# Patient Record
Sex: Male | Born: 1950 | Race: White | Hispanic: No | Marital: Married | Smoking: Former smoker
Health system: Southern US, Community
[De-identification: ages and names within clinical notes are randomized; demographics above are authoritative.]

## PROBLEM LIST (undated history)

## (undated) DIAGNOSIS — I10 Essential (primary) hypertension: Secondary | ICD-10-CM

## (undated) DIAGNOSIS — E7800 Pure hypercholesterolemia, unspecified: Secondary | ICD-10-CM

## (undated) DIAGNOSIS — N400 Enlarged prostate without lower urinary tract symptoms: Secondary | ICD-10-CM

## (undated) DIAGNOSIS — J189 Pneumonia, unspecified organism: Secondary | ICD-10-CM

## (undated) DIAGNOSIS — M109 Gout, unspecified: Secondary | ICD-10-CM

## (undated) DIAGNOSIS — K219 Gastro-esophageal reflux disease without esophagitis: Secondary | ICD-10-CM

## (undated) DIAGNOSIS — N281 Cyst of kidney, acquired: Secondary | ICD-10-CM

## (undated) HISTORY — PX: THROAT SURGERY: SHX803

## (undated) HISTORY — PX: CARPAL TUNNEL RELEASE: SHX101

## (undated) HISTORY — DX: Cyst of kidney, acquired: N28.1

## (undated) HISTORY — PX: BACK SURGERY: SHX140

---

## 2009-12-18 ENCOUNTER — Ambulatory Visit: Payer: Self-pay | Admitting: Cardiology

## 2011-11-25 ENCOUNTER — Encounter (HOSPITAL_COMMUNITY)
Admission: EM | Disposition: A | Discharge status: Home / Self Care | Payer: Self-pay | Source: Home / Self Care | Attending: Emergency Medicine

## 2011-11-25 ENCOUNTER — Emergency Department (HOSPITAL_COMMUNITY): Payer: No Typology Code available for payment source

## 2011-11-25 ENCOUNTER — Encounter (HOSPITAL_COMMUNITY): Payer: Self-pay | Admitting: *Deleted

## 2011-11-25 ENCOUNTER — Emergency Department (HOSPITAL_COMMUNITY): Payer: No Typology Code available for payment source | Admitting: Critical Care Medicine

## 2011-11-25 ENCOUNTER — Encounter (HOSPITAL_COMMUNITY): Payer: Self-pay | Admitting: Critical Care Medicine

## 2011-11-25 ENCOUNTER — Emergency Department (HOSPITAL_COMMUNITY)
Admission: EM | Admit: 2011-11-25 | Discharge: 2011-11-26 | Disposition: A | Discharge status: Home / Self Care | Payer: No Typology Code available for payment source | Attending: Emergency Medicine | Admitting: Emergency Medicine

## 2011-11-25 DIAGNOSIS — I10 Essential (primary) hypertension: Secondary | ICD-10-CM | POA: Insufficient documentation

## 2011-11-25 DIAGNOSIS — L720 Epidermal cyst: Secondary | ICD-10-CM

## 2011-11-25 DIAGNOSIS — K219 Gastro-esophageal reflux disease without esophagitis: Secondary | ICD-10-CM | POA: Insufficient documentation

## 2011-11-25 DIAGNOSIS — M542 Cervicalgia: Secondary | ICD-10-CM | POA: Insufficient documentation

## 2011-11-25 DIAGNOSIS — L0211 Cutaneous abscess of neck: Secondary | ICD-10-CM | POA: Insufficient documentation

## 2011-11-25 DIAGNOSIS — L723 Sebaceous cyst: Secondary | ICD-10-CM | POA: Insufficient documentation

## 2011-11-25 HISTORY — PX: EAR CYST EXCISION: SHX22

## 2011-11-25 HISTORY — DX: Essential (primary) hypertension: I10

## 2011-11-25 LAB — POCT I-STAT, CHEM 8
BUN: 12 mg/dL (ref 6–23)
Creatinine, Ser: 0.9 mg/dL (ref 0.50–1.35)
Hemoglobin: 15.3 g/dL (ref 13.0–17.0)
Potassium: 3.8 mEq/L (ref 3.5–5.1)
Sodium: 143 mEq/L (ref 135–145)

## 2011-11-25 LAB — CBC
Hemoglobin: 15.2 g/dL (ref 13.0–17.0)
MCHC: 36.9 g/dL — ABNORMAL HIGH (ref 30.0–36.0)
RBC: 4.75 MIL/uL (ref 4.22–5.81)

## 2011-11-25 SURGERY — CYST REMOVAL
Anesthesia: General | Wound class: Dirty or Infected

## 2011-11-25 MED ORDER — LACTATED RINGERS IV SOLN
INTRAVENOUS | Status: DC | PRN
  Administered 2011-11-25 (×2): via INTRAVENOUS

## 2011-11-25 MED ORDER — FENTANYL CITRATE 0.05 MG/ML IJ SOLN
INTRAMUSCULAR | Status: DC | PRN
  Administered 2011-11-25 (×2): 50 ug via INTRAVENOUS
  Administered 2011-11-25: 100 ug via INTRAVENOUS
  Administered 2011-11-25 (×2): 50 ug via INTRAVENOUS

## 2011-11-25 MED ORDER — SODIUM CHLORIDE 0.9 % IR SOLN
Status: DC | PRN
  Administered 2011-11-25: 23:00:00

## 2011-11-25 MED ORDER — LIDOCAINE HCL (CARDIAC) 20 MG/ML IV SOLN
INTRAVENOUS | Status: DC | PRN
  Administered 2011-11-25: 40 mg via INTRAVENOUS

## 2011-11-25 MED ORDER — SUCCINYLCHOLINE CHLORIDE 20 MG/ML IJ SOLN
INTRAMUSCULAR | Status: DC | PRN
  Administered 2011-11-25: 100 mg via INTRAVENOUS

## 2011-11-25 MED ORDER — MUPIROCIN CALCIUM 2 % EX CREA
TOPICAL_CREAM | CUTANEOUS | Status: AC
  Filled 2011-11-25: qty 15

## 2011-11-25 MED ORDER — IOHEXOL 300 MG/ML  SOLN
75.0000 mL | Freq: Once | INTRAMUSCULAR | Status: AC | PRN
  Administered 2011-11-25: 75 mL via INTRAVENOUS

## 2011-11-25 MED ORDER — ALBUTEROL SULFATE HFA 108 (90 BASE) MCG/ACT IN AERS
INHALATION_SPRAY | RESPIRATORY_TRACT | Status: DC | PRN
  Administered 2011-11-25: 2 via RESPIRATORY_TRACT
  Administered 2011-11-25: 4 via RESPIRATORY_TRACT

## 2011-11-25 MED ORDER — PROPOFOL 10 MG/ML IV BOLUS
INTRAVENOUS | Status: DC | PRN
  Administered 2011-11-25: 175 mg via INTRAVENOUS

## 2011-11-25 MED ORDER — LIDOCAINE-EPINEPHRINE 1 %-1:100000 IJ SOLN
INTRAMUSCULAR | Status: DC | PRN
  Administered 2011-11-25: 10 mL

## 2011-11-25 MED ORDER — CLINDAMYCIN PHOSPHATE 600 MG/50ML IV SOLN
600.0000 mg | Freq: Once | INTRAVENOUS | Status: AC
  Administered 2011-11-25: 600 mg via INTRAVENOUS
  Filled 2011-11-25: qty 50

## 2011-11-25 MED ORDER — 0.9 % SODIUM CHLORIDE (POUR BTL) OPTIME
TOPICAL | Status: DC | PRN
  Administered 2011-11-25: 1000 mL

## 2011-11-25 MED ORDER — MUPIROCIN 2 % EX OINT
TOPICAL_OINTMENT | CUTANEOUS | Status: DC | PRN
  Administered 2011-11-25: 1 via TOPICAL

## 2011-11-25 MED ORDER — ONDANSETRON HCL 4 MG/2ML IJ SOLN
INTRAMUSCULAR | Status: DC | PRN
  Administered 2011-11-25: 4 mg via INTRAVENOUS

## 2011-11-25 MED ORDER — DEXAMETHASONE SODIUM PHOSPHATE 4 MG/ML IJ SOLN
INTRAMUSCULAR | Status: DC | PRN
  Administered 2011-11-25: 8 mg via INTRAVENOUS

## 2011-11-25 SURGICAL SUPPLY — 24 items
CANISTER SUCTION 2500CC (MISCELLANEOUS) ×2 IMPLANT
CLOTH BEACON ORANGE TIMEOUT ST (SAFETY) ×2 IMPLANT
COVER SURGICAL LIGHT HANDLE (MISCELLANEOUS) ×2 IMPLANT
ELECT COATED BLADE 2.86 ST (ELECTRODE) ×2 IMPLANT
ELECT REM PT RETURN 9FT ADLT (ELECTROSURGICAL) ×2
ELECTRODE REM PT RTRN 9FT ADLT (ELECTROSURGICAL) ×1 IMPLANT
GLOVE BIOGEL PI IND STRL 7.0 (GLOVE) ×2 IMPLANT
GLOVE BIOGEL PI INDICATOR 7.0 (GLOVE) ×2
GLOVE ECLIPSE 6.5 STRL STRAW (GLOVE) ×4 IMPLANT
GLOVE SURG SS PI 7.5 STRL IVOR (GLOVE) ×2 IMPLANT
GOWN BRE IMP SLV AUR LG STRL (GOWN DISPOSABLE) ×6 IMPLANT
KIT BASIN OR (CUSTOM PROCEDURE TRAY) ×2 IMPLANT
PENCIL BUTTON HOLSTER BLD 10FT (ELECTRODE) ×2 IMPLANT
SPECIMEN JAR SMALL (MISCELLANEOUS) ×2 IMPLANT
SUT CHROMIC 3 0 SH 27 (SUTURE) ×2 IMPLANT
SUT CHROMIC 4 0 PS 2 18 (SUTURE) ×2 IMPLANT
SUT SILK 2 0 (SUTURE) ×1
SUT SILK 2-0 18XBRD TIE 12 (SUTURE) ×1 IMPLANT
SUT VIC AB 3-0 SH 27 (SUTURE) ×2
SUT VIC AB 3-0 SH 27X BRD (SUTURE) ×2 IMPLANT
SWAB COLLECTION DEVICE MRSA (MISCELLANEOUS) ×2 IMPLANT
TOWEL OR 17X26 10 PK STRL BLUE (TOWEL DISPOSABLE) ×2 IMPLANT
TRAY ENT MC OR (CUSTOM PROCEDURE TRAY) ×2 IMPLANT
TUBE CONNECTING 20X1/4 (TUBING) ×2 IMPLANT

## 2011-11-25 NOTE — ED Provider Notes (Signed)
Patient being taken to the OR by ENT.  Trevor Mace, PA-C 11/25/11 1742

## 2011-11-25 NOTE — Anesthesia Preprocedure Evaluation (Addendum)
Anesthesia Evaluation  Patient identified by MRN, date of birth, ID band Patient awake    Reviewed: Allergy & Precautions, H&P , NPO status , Patient's Chart, lab work & pertinent test results  Airway Mallampati: II TM Distance: >3 FB Neck ROM: Full    Dental  (+) Dental Advisory Given and Teeth Intact   Pulmonary  breath sounds clear to auscultation        Cardiovascular hypertension, Pt. on medications Rhythm:Regular Rate:Normal     Neuro/Psych    GI/Hepatic GERD-  Medicated,  Endo/Other    Renal/GU      Musculoskeletal   Abdominal   Peds  Hematology   Anesthesia Other Findings   Reproductive/Obstetrics                          Anesthesia Physical Anesthesia Plan  ASA: II and Emergent  Anesthesia Plan: General   Post-op Pain Management:    Induction: Intravenous  Airway Management Planned: Oral ETT  Additional Equipment:   Intra-op Plan:   Post-operative Plan: Extubation in OR  Informed Consent: I have reviewed the patients History and Physical, chart, labs and discussed the procedure including the risks, benefits and alternatives for the proposed anesthesia with the patient or authorized representative who has indicated his/her understanding and acceptance.   Dental advisory given  Plan Discussed with: Anesthesiologist and Surgeon  Anesthesia Plan Comments: (Infected cyst anterior neck htn Obesity Plan GA with ETT with RSI )       Anesthesia Quick Evaluation

## 2011-11-25 NOTE — ED Provider Notes (Addendum)
History   This chart was scribed for Doug Sou, MD by Melba Coon. The patient was seen in room TR05C/TR05C and the patient's care was started at 9:45AM.    CSN: [id number]  Arrival date & time 11/25/11  0932   None     Chief Complaint  Patient presents with  . Neck Pain    bump on neck    (Consider location/radiation/quality/duration/timing/severity/associated sxs/prior treatment) The history is provided by the patient. No language interpreter was used.   Christopher Salazar is a 61 y.o. male who presents to the Emergency Department complaining of constant, mild to moderate anterior neck pain pertaining to an abscess with an onset a week ago. Patient states he's had vague swelling and anterior neck for approximately one year. Pt states that he has had a "knot" in the location of the abscess for a "couple of years" but the area has progressively gotten more swollen and tender to touch within the last week. Pt has taken Equate OTC pain meds which slightly alleviates the pain. Amount of sleep is decreased compared to baseline. Pain with swallowing present. Undocumented fever present. Denies SOB. No known allergies. No other pertinent medical symptoms.  PCP: Dr. Baldemar Friday in [place], [place]  Past Medical History  Diagnosis Date  . Hypertension     History reviewed. No pertinent past surgical history. Past medical history hypertension No family history on file.  History  Substance Use Topics  . Smoking status: Not on file  . Smokeless tobacco: Not on file  . Alcohol Use: No  Pt does not smoke or drink    Review of Systems  Constitutional: Positive for chills.  HENT: Positive for sore throat and neck pain. Negative for ear pain, trouble swallowing, dental problem and voice change.   Respiratory: Negative.   Cardiovascular: Negative.   Gastrointestinal: Negative.   Psychiatric/Behavioral: Negative.   All other systems reviewed and are  negative.      Allergies  Review of patient's allergies indicates no known allergies.  Home Medications   Current Outpatient Rx  Name Route Sig Dispense Refill  . ASPIRIN EC 81 MG PO TBEC Oral Take 81 mg by mouth daily.    . OMEGA-3 FATTY ACIDS 1000 MG PO CAPS Oral Take 1 g by mouth daily.    Marland Kitchen GARLIC HIGH POTENCY PO Oral Take 1 tablet by mouth daily.    . ADULT MULTIVITAMIN W/MINERALS CH Oral Take 1 tablet by mouth daily.    Marland Kitchen OLMESARTAN-AMLODIPINE-HCTZ 40-10-25 MG PO TABS Oral Take 0.5 tablets by mouth 2 (two) times daily.    Marland Kitchen RANITIDINE HCL 75 MG PO TABS Oral Take 75 mg by mouth daily.    Marland Kitchen VITAMIN E 400 UNITS PO CAPS Oral Take 400 Units by mouth daily.      BP 170/91  Temp 97.9 F (36.6 C) (Oral)  Resp 18  SpO2 99%  Physical Exam  Nursing note and vitals reviewed. Constitutional: He is oriented to person, place, and time. He appears well-developed and well-nourished. No distress.  HENT:  Head: Normocephalic and atraumatic.  Mouth/Throat: Oropharynx is clear and moist. No oropharyngeal exudate.  Eyes: EOM are normal.  Neck: Neck supple. No tracheal deviation present.       Swollen reddened area overlying laryngeal cartilage no fluctuance  Cardiovascular: Normal rate, normal heart sounds and intact distal pulses.   No murmur heard. Pulmonary/Chest: Effort normal and breath sounds normal. No respiratory distress. He has no wheezes.  Abdominal: Soft. There is no  tenderness.       Obese  Musculoskeletal: Normal range of motion.  Lymphadenopathy:    He has no cervical adenopathy.  Neurological: He is alert and oriented to person, place, and time.  Skin: Skin is warm and dry. There is erythema (Erythematous swollen area over laryngeal cartilage with mild tenderness but no fluctuance or cervical nodes.).       Erythematous over laryngeal cartilage  Psychiatric: He has a normal mood and affect. His behavior is normal.    ED Course  Procedures (including critical care  time)  DIAGNOSTIC STUDIES: Oxygen Saturation is 99% on room air, normal by my interpretation.    COORDINATION OF CARE:  9:49AM - Pt declines pain meds at this time. IV fluids and neck CT will be ordered for the pt. Pt will be transferred to Pod A.   Labs Reviewed - No data to display No results found.   No diagnosis found.  Results for orders placed during the hospital encounter of 11/25/11  CBC      Component Value Range   WBC 12.0 (*) 4.0 - 10.5 K/uL   RBC 4.75  4.22 - 5.81 MIL/uL   Hemoglobin 15.2  13.0 - 17.0 g/dL   HCT 16.1  09.6 - 04.5 %   MCV 86.7  78.0 - 100.0 fL   MCH 32.0  26.0 - 34.0 pg   MCHC 36.9 (*) 30.0 - 36.0 g/dL   RDW 40.9  81.1 - 91.4 %   Platelets 254  150 - 400 K/uL  POCT I-STAT, CHEM 8      Component Value Range   Sodium 143  135 - 145 mEq/L   Potassium 3.8  3.5 - 5.1 mEq/L   Chloride 105  96 - 112 mEq/L   BUN 12  6 - 23 mg/dL   Creatinine, Ser 7.82  0.50 - 1.35 mg/dL   Glucose, Bld 956 (*) 70 - 99 mg/dL   Calcium, Ion 2.13  0.86 - 1.30 mmol/L   TCO2 24  0 - 100 mmol/L   Hemoglobin 15.3  13.0 - 17.0 g/dL   HCT 57.8  46.9 - 62.9 %   Ct Soft Tissue Neck W Contrast  11/25/2011  *RADIOLOGY REPORT*  Clinical Data: 61 year old male with swollen anterior neck with redness and pain. The patient reports chronic swelling or "knot" in the region, but recent pain and progression.  Laryngeal cartilage abscess.  CT NECK WITH CONTRAST  Technique:  Multidetector CT imaging of the neck was performed with intravenous contrast.  Contrast: 75mL OMNIPAQUE IOHEXOL 300 MG/ML  SOLN  Comparison: None.  Findings: A marker placed on the area of clinical concern overlies the right paramidline rounded mass which appears to be centered in the subcutaneous fat and associated with the dermis and platysma rather than the strap muscles.  There is no laryngeal or thyroid cartilage involvement.  There is no thyroid gland involvement.  The mass measures 27 x 23 x 30 mm with surrounding skin  thickening and stranding.  Internally densitometry suggests a complex fluid.   Thyroid and visualized superior mediastinal structures are within normal limits.  Sternoclavicular joints are within normal limits. Lung apices are negative. Negative larynx, pharynx, parapharyngeal spaces, retropharyngeal space, sublingual space, submandibular glands, parotid glands and visualized brain parenchyma.  Major vascular structures in the neck are patent with minor calcified atherosclerosis.  No level I lymphadenopathy.  No level II or level III lymphadenopathy.  No cervical or superior mediastinal lymphadenopathy elsewhere.  Visualized paranasal sinuses and mastoids are clear.  Degenerative changes in the cervical spine. No acute osseous abnormality identified.  IMPRESSION: 1.  Right of midline anterior neck lesion centered in the subcutaneous fat between the dermis and platysma most resembles an infected dermal cyst (sebaceous cyst or similar).  This measures 27 x 23 x 30 mm and densitometry suggests it contains complex fluid. 2.  The lesion does not involve the laryngeal cartilages or larynx, and location is not indicative of thyroglossal duct cyst. 3.  No cervical lymphadenopathy or other acute findings in the neck.   Original Report Authenticated By: Harley Hallmark, M.D.      MDM  61 year old male with neck pain from an abscess developing over the past week move to CDU awaiting CT scan of his neck. Currently he is resting comfortably in the room and in NAD. Denies any pain. He has not yet developed any difficulty swallowing. Admits to fever and chills over the past week.  4:47 PM CT scan showing an infected dermal cyst. Results discussed with Dr. Rennis Chris who suggests ENT consult. Will obtain ENT consult at this time. Patient is in no apparent distress and in no pain.  Doug Sou, MD 11/25/11 1724  Doug Sou, MD 11/25/11 1757

## 2011-11-25 NOTE — Preoperative (Signed)
Beta Blockers   Reason not to administer Beta Blockers:Not Applicable 

## 2011-11-25 NOTE — ED Notes (Signed)
Patient with red raised bump on neck for approx 1 week and patient states it is increasing in size

## 2011-11-25 NOTE — Op Note (Signed)
DATE OF OPERATION: 11/25/11 11:57 PM Surgeon: Melvenia Beam Procedure Performed: 11403-excision of midline neck cyst measuring 3cm PREOPERATIVE DIAGNOSIS: infected subcutaneous cyst of neck POSTOPERATIVE DIAGNOSIS: infected subcutaneous cyst of neck SURGEON: Melvenia Beam ANESTHESIA: GET ESTIMATED BLOOD LOSS: Approximately 25 mL.  DRAINS: none SPECIMENS: neck mass INDICATIONS: The patient is a 60yo with a history of a subcutaneous neck cyst for several years, likely a sebaceous or epidermal inclusion cyst. This became infected and cellulitis about 1 week ago and he presents with clinical exam and CT scan consistent with a 3 cm subcutaneous infected neck cyst. DESCRIPTION OF OPERATION: The patient was brought to the operating room and was placed in the supine position and intubated and placed under general anesthesia by anesthesiology. A 3:1 length to width ellipse was marked out around the cyst with a sterile marker, taking care to include all of the cellulitic area. The neck was then injected with 1% lidocaine with epinephrine and prepped with sterile betadine and draped in the standard sterile fashion. The ellipse was then incised with the 15 blade down to and through the subcutaneous fat. I came down to the platysma layer with the Bovie. I then carefully dissected circumferentially and on the deep side, staying superficial to the platysma, and dissected the cystic mass off of the platysma. A focal rupture in the cyst was noted and this was filled with purulent fluid and this was cultured. The mass was removed in its entirely and passed off to be sent for permanent pathology. I obtained hemostasis with the Bovie and undermined the skin using the Bovie to take tension off of the closure. I then  Closed the subcutaneous layer with interrupted 3-0 vicryl, and closed the skin with simple interrupted 3-0 chromic gut sutures. I then cleaned off the betadine prep and dressed the incision with mupirocin  ointment.  The patient was turned back to anesthesia and awakened from anesthesia and extubated without difficulty. The patient tolerated the procedure well with no immediate complications and was taken to the postoperative recovery area in good condition.   Dr. Melvenia Beam was present and performed the entire procedure. 11/25/11 11:56 PM Melvenia Beam

## 2011-11-25 NOTE — Consult Note (Signed)
11/25/11 5:35 PM  Christopher Salazar  PREOPERATIVE HISTORY AND PHYSICAL  CHIEF COMPLAINT: infected midline sebaceous cyst  HISTORY: This is a 61 year old male with a long history of a subcutaneous skin mass that has been red and inflammed/swollen for the past week. CT neck shows an infected 2x3cm neck mass most consistent with an infected epidermal or sebaceous cyst. He now presents for removal of the infected cyst.  Dr. Emeline Darling, Christopher Salazar has discussed the risks (including bleeding, infection, cyst recurrence, risks of general anesthesia), benefits, and alternatives of this procedure. The patient understands the risks and would like to proceed with the procedure. The chances of success of the procedure are >50% and the patient understands this. I personally performed an examination of the patient within 24 hours of the procedure.  PAST MEDICAL HISTORY: , Past Medical History  Diagnosis Date  . Hypertension     PAST SURGICAL HISTORY: History reviewed. No pertinent past surgical history.  MEDICATIONS: No current facility-administered medications on file prior to encounter.   Current Outpatient Prescriptions on File Prior to Encounter  Medication Sig Dispense Refill  . Olmesartan-Amlodipine-HCTZ (TRIBENZOR) 40-10-25 MG TABS Take 0.5 tablets by mouth 2 (two) times daily.      . ranitidine (ZANTAC) 75 MG tablet Take 75 mg by mouth daily.        ALLERGIES: No Known Allergies  SOCIAL HISTORY: History   Social History  . Marital Status: Married    Spouse Name: N/A    Number of Children: N/A  . Years of Education: N/A   Occupational History  . Not on file.   Social History Main Topics  . Smoking status: Not on file  . Smokeless tobacco: Not on file  . Alcohol Use: No  . Drug Use: No  . Sexually Active:    Other Topics Concern  . Not on file   Social History Narrative  . No narrative on file    FAMILY HISTORY: No family history on file.  REVIEW OF SYSTEMS:  HEENT: neck  swelling and redness, otherwise negative x 10 systems except per HPI   PHYSICAL EXAM:  GENERAL:  NAD VITAL SIGNS:   Filed Vitals:   11/25/11 1656  BP: 153/88  Pulse: 70  Temp:   Resp: 16   SKIN:  Erythema and cellulitis over the anterior neck HEENT: oral cavity clear  NECK:  Erythema and cellulitis over the anterior neck overlying a palpable 3cm mass that is tender to palpation LYMPH:  Mild BL LAD LUNGS:  Grossly clear CARDIOVASCULAR:  RRR ABDOMEN:  Soft, NT MUSCULOSKELETAL: normal strength PSYCH:  Awake, alert NEUROLOGIC:  CN 2-12 intact and symmetric  DIAGNOSTIC STUDIES: Ct neck with contrast shows a 3cm spherical cyst superficial to the platysma and deep to the skin consistent with infected subcutaneous cyst.  ASSESSMENT AND PLAN: Plan to proceed with excision of infected midline neck cyst. Patient will need post-op antibiotics and steroid taper as outpatient. Patient understands the risks, benefits, and alternatives.  11/25/11 5:35 PM Christopher Salazar

## 2011-11-25 NOTE — ED Notes (Signed)
Family at bedside. 

## 2011-11-25 NOTE — ED Provider Notes (Signed)
Medical screening examination/treatment/procedure(s) were conducted as a shared visit with non-physician practitioner(s) and myself.  I personally evaluated the patient during the encounter  Doug Sou, MD 11/25/11 1801

## 2011-11-25 NOTE — ED Notes (Signed)
CALLED OR. SPOKE WITH MELISSA. WILL BE A COUPLE HOURS BEFORE READY FOR PT

## 2011-11-25 NOTE — ED Notes (Signed)
Patient on the way to the OR.  EKG completed

## 2011-11-25 NOTE — Transfer of Care (Signed)
Immediate Anesthesia Transfer of Care Note  Patient: Christopher Salazar  Procedure(s) Performed: Procedure(s) (LRB): CYST REMOVAL (N/A)  Patient Location: PACU  Anesthesia Type: General  Level of Consciousness: oriented, sedated, patient cooperative and responds to stimulation  Airway & Oxygen Therapy: Patient Spontanous Breathing and Patient connected to nasal cannula oxygen  Post-op Assessment: Report given to PACU RN, Post -op Vital signs reviewed and stable, Patient moving all extremities and Patient moving all extremities X 4  Post vital signs: Reviewed and stable  Complications: No apparent anesthesia complications

## 2011-11-25 NOTE — Anesthesia Procedure Notes (Signed)
Procedure Name: Intubation Date/Time: 11/25/2011 10:19 PM Performed by: Molli Hazard Pre-anesthesia Checklist: Patient identified, Emergency Drugs available, Suction available and Patient being monitored Patient Re-evaluated:Patient Re-evaluated prior to inductionOxygen Delivery Method: Circle system utilized Preoxygenation: Pre-oxygenation with 100% oxygen Intubation Type: IV induction, Rapid sequence and Cricoid Pressure applied Laryngoscope Size: Miller and 2 Grade View: Grade II Tube type: Oral Tube size: 7.5 mm Number of attempts: 1 Airway Equipment and Method: Stylet Placement Confirmation: ETT inserted through vocal cords under direct vision,  positive ETCO2 and breath sounds checked- equal and bilateral Secured at: 22 cm Tube secured with: Tape Dental Injury: Teeth and Oropharynx as per pre-operative assessment

## 2011-11-26 ENCOUNTER — Encounter (HOSPITAL_COMMUNITY): Payer: Self-pay | Admitting: Otolaryngology

## 2011-11-26 MED ORDER — ONDANSETRON HCL 4 MG/2ML IJ SOLN: 4.0000 mg | Freq: Once | INTRAMUSCULAR | Status: DC | PRN

## 2011-11-26 MED ORDER — HYDROMORPHONE HCL PF 1 MG/ML IJ SOLN: 0.2500 mg | INTRAMUSCULAR | Status: DC | PRN

## 2011-11-26 MED ORDER — ACETAMINOPHEN 10 MG/ML IV SOLN: 1000.0000 mg | Freq: Once | INTRAVENOUS | Status: DC | PRN

## 2011-11-26 NOTE — Anesthesia Postprocedure Evaluation (Signed)
  Anesthesia Post-op Note  Patient: Christopher Salazar  Procedure(s) Performed: Procedure(s) (LRB): CYST REMOVAL (N/A)  Patient Location: PACU  Anesthesia Type: General  Level of Consciousness: awake, alert  and oriented  Airway and Oxygen Therapy: Patient Spontanous Breathing  Post-op Pain: none  Post-op Assessment: Post-op Vital signs reviewed and Patient's Cardiovascular Status Stable  Post-op Vital Signs: stable  Complications: No apparent anesthesia complications

## 2011-11-28 LAB — CULTURE, ROUTINE-ABSCESS

## 2016-03-25 HISTORY — PX: CARDIAC CATHETERIZATION: SHX172

## 2017-06-25 ENCOUNTER — Encounter (INDEPENDENT_AMBULATORY_CARE_PROVIDER_SITE_OTHER): Payer: Self-pay | Admitting: Orthopaedic Surgery

## 2017-06-25 ENCOUNTER — Ambulatory Visit (INDEPENDENT_AMBULATORY_CARE_PROVIDER_SITE_OTHER): Payer: Medicare Other | Admitting: Orthopaedic Surgery

## 2017-06-25 ENCOUNTER — Ambulatory Visit (INDEPENDENT_AMBULATORY_CARE_PROVIDER_SITE_OTHER): Payer: Medicare Other

## 2017-06-25 VITALS — BP 95/55 | HR 76 | Resp 16 | Ht 71.0 in | Wt 261.0 lb

## 2017-06-25 DIAGNOSIS — M545 Low back pain: Secondary | ICD-10-CM

## 2017-06-25 NOTE — Progress Notes (Signed)
Office Visit Note   Patient: Christopher Salazar           Date of Birth: 07-Dec-1950           MRN: [id number] Visit Date: 06/25/2017              Requested by: [name], DO [address] [place], [place] [address] PCP: [name], DO   Assessment & Plan: Visit Diagnoses:  1. Acute midline low back pain, with sciatica presence unspecified     Plan: Mr. Christopher Salazar relates that he has had a chronic history of back problems with referred discomfort to his right lower extremity.  Recently he has had an exacerbation of the same pain.  The pain has become more frequent and "severe".  It seems to originate in the area of his back to his right buttock across the top of his thigh to the knee and is far distally as the medial right foot and ankle.  He has not had any bowel or bladder dysfunction.  Approximately 35 years ago he had some "surgery".  Has not had any evaluation since that time believe his pain originates from his back and would suggest an MRI scan.  He does have significant degenerative changes by plain film.  Long discussion  with him regarding the above.  Follow-Up Instructions: No follow-ups on file.   Orders:  Orders Placed This Encounter  Procedures  . XR Lumbar Spine 2-3 Views  . XR Pelvis 1-2 Views   No orders of the defined types were placed in this encounter.     Procedures: No procedures performed   Clinical Data: No additional findings.   Subjective: Chief Complaint  Patient presents with  . Right Hip - Pain    R HIP/LEG PAIN. UNABLE TO LIFT LEG TO GET INTO VEHICLE FOR FEW MONTHS   . Follow-up    R HIP/LEG PAIN. UNABLE TO LIFT LEG TO GET INTO VEHICLE FOR FEW MONTHS   Mr. Christopher Salazar has been experiencing some pain in his back and right lower extremity for well over 5 years and possibly longer.  He has had a recent exacerbation of the same pain without injury or trauma.  Pain originates in the area of his right back and buttock and  radiates across the knee to the right foot and ankle.  He does have some numbness at night.  He denies any bowel bladder dysfunction.  35 years ago he had some back surgery for a "ruptured disc".  He relates that he cannot take NSAIDs as he has "bad kidneys".  He is tried some heat.  HPI  Review of Systems  Constitutional: Negative for fever.  HENT: Negative for ear pain.   Eyes: Negative for pain.  Respiratory: Negative for cough.   Cardiovascular: Negative for leg swelling.  Gastrointestinal: Negative for constipation and diarrhea.  Genitourinary: Negative for difficulty urinating.  Musculoskeletal: Positive for back pain. Negative for neck pain.  Skin: Negative for rash and wound.  Allergic/Immunologic: Negative for food allergies.  Neurological: Positive for numbness. Negative for dizziness, weakness and headaches.  Hematological: Bruises/bleeds easily.  Psychiatric/Behavioral: Positive for sleep disturbance.     Objective: Vital Signs: BP (!) 95/55 (BP Location: Left Arm, Patient Position: Sitting, Cuff Size: Normal)   Pulse 76   Resp 16   Ht 5\' 11"  (1.803 m)   Wt 261 lb (118.4 kg)   BMI 36.40 kg/m   Physical Exam  Ortho Exam awake alert and oriented x3.  Comfortable sitting.  Straight leg raise negative bilaterally.  No pain with range of motion of either hip with internal or external rotation or either knee.  Mild venous stasis changes in both ankles distally.  +1 pulses.  Normal sensibility.  Motor exam intact.  Groin pain.  No pain over the greater trochanter.  No percussible tenderness of the lumbar spine  Specialty Comments:  No specialty comments available.  Imaging: No results found.   PMFS History: There are no active problems to display for this patient.  Past Medical History:  Diagnosis Date  . Hypertension   . Kidney cysts     History reviewed. No pertinent family history.  Past Surgical History:  Procedure Laterality Date  . BACK SURGERY    . EAR  CYST EXCISION  11/25/2011   Procedure: CYST REMOVAL;  Surgeon: Melvenia BeamMitchell Gore, MD;  [facility];  Service: ENT;  Laterality: N/A;  . THROAT SURGERY     Social History   Occupational History  . Not on file  Tobacco Use  . Smoking status: Former Games developermoker  . Smokeless tobacco: Never Used  Substance and Sexual Activity  . Alcohol use: No  . Drug use: No  . Sexual activity: Not on file

## 2017-06-30 ENCOUNTER — Other Ambulatory Visit (INDEPENDENT_AMBULATORY_CARE_PROVIDER_SITE_OTHER): Payer: Self-pay | Admitting: Orthopaedic Surgery

## 2017-06-30 DIAGNOSIS — M545 Low back pain: Secondary | ICD-10-CM

## 2017-06-30 DIAGNOSIS — M79604 Pain in right leg: Secondary | ICD-10-CM

## 2017-07-01 ENCOUNTER — Ambulatory Visit
Admission: RE | Admit: 2017-07-01 | Discharge: 2017-07-01 | Disposition: A | Discharge status: Home / Self Care | Payer: Medicare Other | Source: Ambulatory Visit | Attending: Orthopaedic Surgery | Admitting: Orthopaedic Surgery

## 2017-07-01 DIAGNOSIS — M79604 Pain in right leg: Secondary | ICD-10-CM

## 2017-07-01 DIAGNOSIS — M545 Low back pain: Secondary | ICD-10-CM

## 2017-07-02 ENCOUNTER — Encounter (INDEPENDENT_AMBULATORY_CARE_PROVIDER_SITE_OTHER): Payer: Self-pay | Admitting: Orthopaedic Surgery

## 2017-07-02 ENCOUNTER — Ambulatory Visit (INDEPENDENT_AMBULATORY_CARE_PROVIDER_SITE_OTHER): Payer: Medicare Other | Admitting: Orthopaedic Surgery

## 2017-07-02 VITALS — BP 114/66 | HR 62 | Resp 18 | Ht 71.0 in | Wt 261.0 lb

## 2017-07-02 DIAGNOSIS — G8929 Other chronic pain: Secondary | ICD-10-CM | POA: Diagnosis not present

## 2017-07-02 DIAGNOSIS — M5441 Lumbago with sciatica, right side: Secondary | ICD-10-CM

## 2017-07-02 NOTE — Progress Notes (Signed)
Office Visit Note   Patient: Christopher Salazar           Date of Birth: Jun 03, 1950           MRN: [id number] Visit Date: 07/02/2017              Requested by: Joaquin CourtsFavero, John Patrick, DO [address] [place], [place] [id number] PCP: Joaquin CourtsFavero, John Patrick, DO   Assessment & Plan: Visit Diagnoses:  1. Chronic right-sided low back pain with right-sided sciatica     Plan: MRI scan demonstrates a prior left laminectomy at L4-5.  There is a central disc extrusion with caudal migration at the same level.  There is also facet arthropathy and a right ligamentum flavum hypertrophy.  Mild stenosis.  Right greater than left L5 nerve root impingement.  Disc material extends into the foramen on both sides. Either L4 nerve root could be affected.  I would like Dr. Ophelia CharterYates to evaluate Mr. Christopher Salazar to see if he would  be a candidate for lumbar laminectomy with or without fusion versus epidural steroid injection.  We have made an appointment for Dr. Ophelia CharterYates to see him in the morning.  Long discussion with Mr. Christopher Salazar regarding the findings and the potential treatment options  Follow-Up Instructions: Return appt with Dr Ophelia CharterYates in am.   Orders:  No orders of the defined types were placed in this encounter.  No orders of the defined types were placed in this encounter.     Procedures: No procedures performed   Clinical Data: No additional findings.   Subjective: Chief Complaint  Patient presents with  . Follow-up    L SPINE MRI REVIEW  Mr. Christopher Salazar was recently evaluated for chronic back pain associated with referred discomfort into his right lower extremity particularly along his thigh.  I ordered an MRI scan with the findings as above.  He has had very minimal symptoms on the left.  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  HENT:  Negative for ear pain.   Eyes: Negative for pain.  Respiratory: Negative for cough.   Cardiovascular: Negative for leg swelling.  Gastrointestinal: Negative for constipation and diarrhea.  Genitourinary: Negative for difficulty urinating.  Musculoskeletal: Positive for back pain. Negative for neck pain.  Skin: Negative for rash.  Allergic/Immunologic: Negative for food allergies.  Neurological: Positive for weakness and numbness.  Psychiatric/Behavioral: Positive for sleep disturbance.     Objective: Vital Signs: BP 114/66 (BP Location: Left Arm, Patient Position: Sitting, Cuff Size: Normal)   Pulse 62   Resp 18   Ht 5\' 11"  (1.803 m)   Wt 261 lb (118.4 kg)   BMI 36.40 kg/m   Physical Exam  Ortho Exam awake alert and oriented x3.  Comfortable sitting.  Straight leg raise is minimally positive on the right for back pain.  No pain with motion of his left leg.  No pain with internal or external rotation of either hip.  Neurovascular exam appears to be intact except possibly slight decrease in the right knee reflex.  No percussible tenderness of the lumbar spine.  Specialty Comments:  No specialty comments available.  Imaging: No results found.   PMFS History: There are no active problems to display for this patient.  Past Medical History:  Diagnosis Date  . Hypertension   . Kidney  cysts     History reviewed. No pertinent family history.  Past Surgical History:  Procedure Laterality Date  . BACK SURGERY    . EAR CYST EXCISION  11/25/2011   Procedure: CYST REMOVAL;  Surgeon: Melvenia Beam, MD;  Location: [facility] OR;  Service: ENT;  Laterality: N/A;  . THROAT SURGERY     Social History   Occupational History  . Not on file  Tobacco Use  . Smoking status: Former Games developer  . Smokeless tobacco: Never Used  Substance and Sexual Activity  . Alcohol use: No  . Drug use: No  . Sexual activity: Not on file

## 2017-07-03 ENCOUNTER — Ambulatory Visit (INDEPENDENT_AMBULATORY_CARE_PROVIDER_SITE_OTHER): Payer: Medicare Other | Admitting: Orthopaedic Surgery

## 2017-07-03 ENCOUNTER — Encounter (INDEPENDENT_AMBULATORY_CARE_PROVIDER_SITE_OTHER): Payer: Self-pay | Admitting: Orthopaedic Surgery

## 2017-07-03 VITALS — BP 114/53 | HR 69 | Ht 71.0 in | Wt 261.0 lb

## 2017-07-03 DIAGNOSIS — M5441 Lumbago with sciatica, right side: Secondary | ICD-10-CM

## 2017-07-03 DIAGNOSIS — G8929 Other chronic pain: Secondary | ICD-10-CM

## 2017-07-03 NOTE — Progress Notes (Signed)
Office Visit Note   Patient: Christopher Salazar           Date of Birth: 11-14-1950           MRN: [id number] Visit Date: 07/03/2017              Requested by: [name], DO [address] [place], [place] [address] PCP: [name], DO   Assessment & Plan: Visit Diagnoses:  1. 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  Will schedule him for single epidural see if he gets relief if not then we can proceed with surgery for his recurrent disc herniation on the right side at the L4-5 level.  Pathophysiology discussed MRI scan was reviewed.  We discussed operative technique.  Hopefully will settle down with the epidural.  Follow-Up Instructions: Office follow-up after epidural at L4-5.  Orders:  Orders Placed This Encounter  Procedures  . Ambulatory referral to Physical Medicine Rehab   No orders of the defined types were placed in this encounter.     Procedures: No procedures performed   Clinical Data: No additional findings.   Subjective: Chief Complaint  Patient presents with  . Lower Back - Pain    HPI 67 year old male sent to me by Dr. Cleophas Dunker for consideration for surgery at L4-5.  Previous left laminectomy at L4 535 years ago done by Dr. Jean Rosenthal in [place].  Patient did well until a few months ago when he had increased back pain right leg pain with numbness and tingling progressive pain difficulty ambulating.  He is used a cane and is had an MRI scan 07/01/2017 and lumbar spine that showed centrally recurrent large disc at L4-5 caudally migrated right paracentral with right greater than left L5 nerve root compression.  No significant changes at other levels.  Hip and knee x-ray showed some mild arthritis which are not currently symptomatic.  Review of Systems 14 point review of systems updated unchanged from 07/02/2017 office visit by Dr.  Cleophas Dunker otherwise as noted in HPI.  Objective: Vital Signs: BP (!) 114/53   Pulse 69   Ht 5\' 11"  (1.803 m)   Wt 261 lb (118.4 kg)   BMI 36.40 kg/m   Physical Exam  Constitutional: He is oriented to person, place, and time. He appears well-developed and well-nourished.  HENT:  Head: Normocephalic and atraumatic.  Eyes: Pupils are equal, round, and reactive to light. EOM are normal.  Neck: No tracheal deviation present. No thyromegaly present.  Cardiovascular: Normal rate.  Pulmonary/Chest: Effort normal. He has no wheezes.  Abdominal: Soft. Bowel sounds are normal.  Neurological: He is alert and oriented to person, place, and time.  Skin: Skin is warm and dry. Capillary refill takes less than 2 seconds.  Psychiatric: He has a normal mood and affect. His behavior is normal. Judgment and thought content normal.    Ortho Exam well-healed lumbar incision from L4-S1.  No tenderness at the incision.  He does have pain with straight leg raising on the right at 80 degrees.  Positive sciatic notch tenderness on the right minimal on the left.  Knee and ankle jerk are intact.  Hip range of motion logroll is negative.  No rash over exposed skin negative Homans tear.  Specialty Comments:  No specialty comments available.  Imaging: CLINICAL DATA:  Low back and RIGHT leg pain. History of back surgery many years ago.  EXAM: MRI  LUMBAR SPINE WITHOUT CONTRAST  TECHNIQUE: Multiplanar, multisequence MR imaging of the lumbar spine was performed. No intravenous contrast was administered.  COMPARISON:  None.  FINDINGS: Segmentation:  Standard  Alignment:  Anatomic  Vertebrae:  No worrisome osseous lesion  Conus medullaris and cauda equina: Conus extends to the L1 level. Conus and cauda equina appear normal.  Paraspinal and other soft tissues: Renal cystic disease, incompletely evaluated.  Disc levels:  L1-L2:  Slight disc desiccation.  No protrusion or  impingement.  L2-L3:  Normal disc space.  Mild facet arthropathy.  No impingement.  L3-L4:  Disc desiccation.  Facet arthropathy.  No impingement.  L4-L5: LEFT laminectomy. Central disc extrusion with caudal migration. Facet arthropathy and RIGHT ligamentum flavum hypertrophy. Mild stenosis. RIGHT greater than LEFT L5 nerve root impingement. Disc material extends into the foramen on both sides. Either L4 nerve root could be affected.  L5-S1: Slight loss of disc height. Annular bulging. Facet arthropathy. No impingement.  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 Caudally migrated component. Facet arthropathy and ligamentum flavum hypertrophy. Loss of interspace height with mild stenosis. RIGHT greater than LEFT L5 nerve root impingement; foraminal narrowing could affect either L4 nerve root.  Otherwise unremarkable MRI lumbar spine.   Electronically Signed   By: Elsie StainJohn T Curnes M.D.   On: 07/01/2017 09:34    PMFS History: Patient Active Problem List   Diagnosis Date Noted  . Chronic right-sided low back pain with right-sided sciatica 07/04/2017   Past Medical History:  Diagnosis Date  . Hypertension   . Kidney cysts     No family history on file.  Past Surgical History:  Procedure Laterality Date  . BACK SURGERY    . EAR CYST EXCISION  11/25/2011   Procedure: CYST REMOVAL;  Surgeon: Melvenia BeamMitchell Gore, MD;  [facility] OR;  Service: ENT;  Laterality: N/A;  . THROAT SURGERY     Social History   Occupational History  . Not on file  Tobacco Use  . Smoking status: Former Games developermoker  . Smokeless tobacco: Never Used  Substance and Sexual Activity  . Alcohol use: No  . Drug use: No  . Sexual activity: Not on file

## 2017-07-04 ENCOUNTER — Encounter (INDEPENDENT_AMBULATORY_CARE_PROVIDER_SITE_OTHER): Payer: Self-pay | Admitting: Orthopaedic Surgery

## 2017-07-04 DIAGNOSIS — M5441 Lumbago with sciatica, right side: Principal | ICD-10-CM

## 2017-07-04 DIAGNOSIS — G8929 Other chronic pain: Secondary | ICD-10-CM | POA: Insufficient documentation

## 2017-07-17 ENCOUNTER — Encounter

## 2017-07-17 ENCOUNTER — Ambulatory Visit (INDEPENDENT_AMBULATORY_CARE_PROVIDER_SITE_OTHER): Payer: Medicare Other | Admitting: Physical Medicine and Rehabilitation

## 2017-07-17 ENCOUNTER — Encounter (INDEPENDENT_AMBULATORY_CARE_PROVIDER_SITE_OTHER): Payer: Self-pay | Admitting: Physical Medicine and Rehabilitation

## 2017-07-17 ENCOUNTER — Ambulatory Visit (INDEPENDENT_AMBULATORY_CARE_PROVIDER_SITE_OTHER): Payer: Medicare Other

## 2017-07-17 VITALS — BP 137/86 | HR 61 | Temp 98.3°F

## 2017-07-17 DIAGNOSIS — M5416 Radiculopathy, lumbar region: Secondary | ICD-10-CM

## 2017-07-17 MED ORDER — BETAMETHASONE SOD PHOS & ACET 6 (3-3) MG/ML IJ SUSP
12.0000 mg | Freq: Once | INTRAMUSCULAR | Status: AC
  Administered 2017-07-17: 12 mg

## 2017-07-17 NOTE — Progress Notes (Signed)
 .  Numeric Pain Rating Scale and Functional Assessment Average Pain 7   In the last MONTH (on 0-10 scale) has pain interfered with the following?  1. General activity like being  able to carry out your everyday physical activities such as walking, climbing stairs, carrying groceries, or moving a chair?  Rating(5)   +Driver, -BT, -Dye Allergies.  

## 2017-07-17 NOTE — Patient Instructions (Signed)

## 2017-07-22 ENCOUNTER — Encounter (INDEPENDENT_AMBULATORY_CARE_PROVIDER_SITE_OTHER): Payer: Self-pay | Admitting: Physical Medicine and Rehabilitation

## 2017-07-31 ENCOUNTER — Ambulatory Visit (INDEPENDENT_AMBULATORY_CARE_PROVIDER_SITE_OTHER): Payer: Medicare Other | Admitting: Orthopaedic Surgery

## 2017-08-01 NOTE — Procedures (Signed)
Lumbosacral Transforaminal Epidural Steroid Injection - Sub-Pedicular Approach with Fluoroscopic Guidance  Patient: Christopher Salazar      Date of Birth: 1950-06-09 MRN: [id number] PCP: [name], DO      Visit Date: 07/17/2017   Universal Protocol:    Date/Time: 07/17/2017  Consent Given By: the patient  Position: PRONE  Additional Comments: Vital signs were monitored before and after the procedure. Patient was prepped and draped in the usual sterile fashion. The correct patient, procedure, and site was verified.   Injection Procedure Details:  Procedure Site One Meds Administered:  Meds ordered this encounter  Medications  . betamethasone acetate-betamethasone sodium phosphate (CELESTONE) injection 12 mg    Laterality: Right  Location/Site:  L4-L5 L5-S1  Needle size: 22 G  Needle type: Spinal  Needle Placement: Transforaminal  Findings:    -Comments: Excellent flow of contrast along the nerve and into the epidural space.  Procedure Details: After squaring off the end-plates to get a true AP view, the C-arm was positioned so that an oblique view of the foramen as noted above was visualized. The target area is just inferior to the "nose of the scotty dog" or sub pedicular. The soft tissues overlying this structure were infiltrated with 2-3 ml. of 1% Lidocaine without Epinephrine.  The spinal needle was inserted toward the target using a "trajectory" view along the fluoroscope beam.  Under AP and lateral visualization, the needle was advanced so it did not puncture dura and was located close the 6 O'Clock position of the pedical in AP tracterory. Biplanar projections were used to confirm position. Aspiration was confirmed to be negative for CSF and/or blood. A 1-2 ml. volume of Isovue-250 was injected and flow of contrast was noted at each level. Radiographs were obtained for documentation purposes.   After attaining the desired flow of contrast documented  above, a 0.5 to 1.0 ml test dose of 0.25% Marcaine was injected into each respective transforaminal space.  The patient was observed for 90 seconds post injection.  After no sensory deficits were reported, and normal lower extremity motor function was noted,   the above injectate was administered so that equal amounts of the injectate were placed at each foramen (level) into the transforaminal epidural space.   Additional Comments:  The patient tolerated the procedure well Dressing: Band-Aid    Post-procedure details: Patient was observed during the procedure. Post-procedure instructions were reviewed.  Patient left the clinic in stable condition.

## 2017-08-01 NOTE — Progress Notes (Signed)
Christopher Salazar - 67 y.o. male MRN [id number]  Date of birth: [birth year]  Office Visit Note: Visit Date: 07/17/2017 PCP: [name], DO Referred by: [name], DO  Subjective: Chief Complaint  Patient presents with  . Lower Back - Pain  . Right Leg - Pain   HPI: Christopher Salazar is a 67 year old gentleman with chronic worsening severe right hip and leg pain consistent with an L4-L5 radicular pattern.  He has been followed by Dr. Ophelia Charter he requests right L4 and L5 transforaminal steroid injection.  Patient does have disc extrusion at the L4-5 level.  The injection  will be diagnostic and hopefully therapeutic. The patient has failed conservative care including time, medications and activity modification.  We will see him back in a couple weeks for possible repeat versus interlaminar injection.   ROS Otherwise per HPI.  Assessment & Plan: Visit Diagnoses:  1. Lumbar radiculopathy     Plan: No additional findings.   Meds & Orders:  Meds ordered this encounter  Medications  . betamethasone acetate-betamethasone sodium phosphate (CELESTONE) injection 12 mg    Orders Placed This Encounter  Procedures  . XR C-ARM NO REPORT  . Epidural Steroid injection    Follow-up: Return in about 2 weeks (around 07/31/2017).   Procedures: No procedures performed  Lumbosacral Transforaminal Epidural Steroid Injection - Sub-Pedicular Approach with Fluoroscopic Guidance  Patient: Christopher Salazar      Date of Birth: Feb 16, 1951 MRN: [id number] PCP: [name], DO      Visit Date: 07/17/2017   Universal Protocol:    Date/Time: 07/17/2017  Consent Given By: the patient  Position: PRONE  Additional Comments: Vital signs were monitored before and after the procedure. Patient was prepped and draped in the usual sterile fashion. The correct patient, procedure, and site was verified.   Injection Procedure Details:  Procedure Site One Meds Administered:    Meds ordered this encounter  Medications  . betamethasone acetate-betamethasone sodium phosphate (CELESTONE) injection 12 mg    Laterality: Right  Location/Site:  L4-L5 L5-S1  Needle size: 22 G  Needle type: Spinal  Needle Placement: Transforaminal  Findings:    -Comments: Excellent flow of contrast along the nerve and into the epidural space.  Procedure Details: After squaring off the end-plates to get a true AP view, the C-arm was positioned so that an oblique view of the foramen as noted above was visualized. The target area is just inferior to the "nose of the scotty dog" or sub pedicular. The soft tissues overlying this structure were infiltrated with 2-3 ml. of 1% Lidocaine without Epinephrine.  The spinal needle was inserted toward the target using a "trajectory" view along the fluoroscope beam.  Under AP and lateral visualization, the needle was advanced so it did not puncture dura and was located close the 6 O'Clock position of the pedical in AP tracterory. Biplanar projections were used to confirm position. Aspiration was confirmed to be negative for CSF and/or blood. A 1-2 ml. volume of Isovue-250 was injected and flow of contrast was noted at each level. Radiographs were obtained for documentation purposes.   After attaining the desired flow of contrast documented above, a 0.5 to 1.0 ml test dose of 0.25% Marcaine was injected into each respective transforaminal space.  The patient was observed for 90 seconds post injection.  After no sensory deficits were reported, and normal lower extremity motor function was noted,   the above injectate was administered so that equal amounts of  the injectate were placed at each foramen (level) into the transforaminal epidural space.   Additional Comments:  The patient tolerated the procedure well Dressing: Band-Aid    Post-procedure details: Patient was observed during the procedure. Post-procedure instructions were  reviewed.  Patient left the clinic in stable condition.    Clinical History: MRI LUMBAR SPINE WITHOUT CONTRAST  TECHNIQUE: Multiplanar, multisequence MR imaging of the lumbar spine was performed. No intravenous contrast was administered.  COMPARISON:  None.  FINDINGS: Segmentation:  Standard  Alignment:  Anatomic  Vertebrae:  No worrisome osseous lesion  Conus medullaris and cauda equina: Conus extends to the L1 level. Conus and cauda equina appear normal.  Paraspinal and other soft tissues: Renal cystic disease, incompletely evaluated.  Disc levels:  L1-L2:  Slight disc desiccation.  No protrusion or impingement.  L2-L3:  Normal disc space.  Mild facet arthropathy.  No impingement.  L3-L4:  Disc desiccation.  Facet arthropathy.  No impingement.  L4-L5: LEFT laminectomy. Central disc extrusion with caudal migration. Facet arthropathy and RIGHT ligamentum flavum hypertrophy. Mild stenosis. RIGHT greater than LEFT L5 nerve root impingement. Disc material extends into the foramen on both sides. Either L4 nerve root could be affected.  L5-S1: Slight loss of disc height. Annular bulging. Facet arthropathy. No impingement.  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 Caudally migrated component. Facet arthropathy and ligamentum flavum hypertrophy. Loss of interspace height with mild stenosis. RIGHT greater than LEFT L5 nerve root impingement; foraminal narrowing could affect either L4 nerve root.  Otherwise unremarkable MRI lumbar spine.   Electronically Signed   By: Elsie Stain M.D.   On: 07/01/2017 09:34   He reports that he has quit smoking. He has never used smokeless tobacco. No results for input(s): HGBA1C, LABURIC in the last 8760 hours.  Objective:  VS:  HT:    WT:   BMI:     BP:137/86  HR:61bpm  TEMP:98.3 F (36.8 C)(Oral)  RESP:98 % Physical Exam  Ortho Exam Imaging: No results found.  Past  Medical/Family/Surgical/Social History: Medications & Allergies reviewed per EMR, new medications updated. Patient Active Problem List   Diagnosis Date Noted  . Chronic right-sided low back pain with right-sided sciatica 07/04/2017   Past Medical History:  Diagnosis Date  . Hypertension   . Kidney cysts    History reviewed. No pertinent family history. Past Surgical History:  Procedure Laterality Date  . BACK SURGERY    . EAR CYST EXCISION  11/25/2011   Procedure: CYST REMOVAL;  Surgeon: Melvenia Beam, MD;  Location: [facility] OR;  Service: ENT;  Laterality: N/A;  . THROAT SURGERY     Social History   Occupational History  . Not on file  Tobacco Use  . Smoking status: Former Games developer  . Smokeless tobacco: Never Used  Substance and Sexual Activity  . Alcohol use: No  . Drug use: No  . Sexual activity: Not on file

## 2017-08-07 ENCOUNTER — Ambulatory Visit (INDEPENDENT_AMBULATORY_CARE_PROVIDER_SITE_OTHER): Payer: Medicare Other | Admitting: Orthopaedic Surgery

## 2017-08-07 ENCOUNTER — Encounter (INDEPENDENT_AMBULATORY_CARE_PROVIDER_SITE_OTHER): Payer: Self-pay | Admitting: Orthopaedic Surgery

## 2017-08-07 VITALS — BP 118/64 | HR 78 | Ht 71.0 in | Wt 271.0 lb

## 2017-08-07 DIAGNOSIS — R200 Anesthesia of skin: Secondary | ICD-10-CM | POA: Diagnosis not present

## 2017-08-07 DIAGNOSIS — M5441 Lumbago with sciatica, right side: Secondary | ICD-10-CM

## 2017-08-07 DIAGNOSIS — G8929 Other chronic pain: Secondary | ICD-10-CM | POA: Diagnosis not present

## 2017-08-07 NOTE — Progress Notes (Signed)
Office Visit Note   Patient: Christopher Salazar           Date of Birth: 13-Jan-1951           MRN: [id number] Visit Date: 08/07/2017              Requested by: [name], DO [address] [place], [place] [address] PCP: [name], DO   Assessment & Plan: Visit Diagnoses:  1. Chronic right-sided low back pain with right-sided sciatica   2. Bilateral hand numbness     Plan: We discussed holding off on a second epidural at this point.  We will place him in some wrist splints for the numbness in his hand in the median distribution it bothers him at night.  Follow-Up Instructions: Return in about 2 months (around 10/07/2017).   Orders:  No orders of the defined types were placed in this encounter.  No orders of the defined types were placed in this encounter.     Procedures: No procedures performed   Clinical Data: No additional findings.   Subjective: Chief Complaint  Patient presents with  . Lower Back - Pain, Follow-up    Post ESI    HPI 67 year old male returns for recurrent L4-5 right paracentral disc protrusion.  He got 80% relief with the epidural that was done on 07/17/2017.  It is now been 3 weeks since his injection and he still has 80% relief.  He thinks on the things he does such as mowing the yard weed eating turning twisting working on old cars may be aggravating his back symptoms.  Review of Systems 14 point updated and unchanged from last office visit other than as mentioned above.   Objective: Vital Signs: BP 118/64   Pulse 78   Ht  (1.803 m)   Wt 271 lb (122.9 kg)   BMI 37.80 kg/m   Physical Exam  Constitutional: He is oriented to person, place, and time. He appears well-developed and well-nourished.  HENT:  Head: Normocephalic and atraumatic.  Eyes: Pupils are equal, round, and reactive to light. EOM are normal.  Neck: No tracheal deviation present. No thyromegaly present.  Cardiovascular: Normal rate.    Pulmonary/Chest: Effort normal. He has no wheezes.  Abdominal: Soft. Bowel sounds are normal.  Neurological: He is alert and oriented to person, place, and time.  Skin: Skin is warm and dry. Capillary refill takes less than 2 seconds.  Psychiatric: He has a normal mood and affect. His behavior is normal. Judgment and thought content normal.    Ortho Exam patient has some pain with straight leg raising on the right negative popliteal compression test anterior tib EHL is strong symmetrically.  Well-healed lumbar incision from previous microdiscectomy 30 years ago.  No gastrocsoleus atrophy quads are strong. Negative brachial plexus tenderness.  Negative Spurling.  Good flexion-extension of the cervical spine.  Trace thenar atrophy with thumb abduction with slight weakness.  Normal sensation ulnar distribution bilaterally.  No hyperthenar weakness. Specialty Comments:  No specialty comments available.  Imaging: No results found.   PMFS History: Patient Active Problem List   Diagnosis Date Noted  . Chronic right-sided low back pain with right-sided sciatica 07/04/2017   Past Medical History:  Diagnosis Date  . Hypertension   . Kidney cysts     No family history on file.  Past Surgical History:  Procedure Laterality Date  . BACK SURGERY    . EAR CYST EXCISION  11/25/2011   Procedure: CYST REMOVAL;  Surgeon: Melvenia Beam, MD;  Location: [facility] OR;  Service: ENT;  Laterality: N/A;  . THROAT SURGERY     Social History   Occupational History  . Not on file  Tobacco Use  . Smoking status: Former Games developer  . Smokeless tobacco: Never Used  Substance and Sexual Activity  . Alcohol use: No  . Drug use: No  . Sexual activity: Not on file

## 2017-08-13 ENCOUNTER — Telehealth (INDEPENDENT_AMBULATORY_CARE_PROVIDER_SITE_OTHER): Payer: Self-pay | Admitting: Radiology

## 2017-08-13 DIAGNOSIS — R200 Anesthesia of skin: Secondary | ICD-10-CM

## 2017-08-13 NOTE — Telephone Encounter (Signed)
OK rule out CTS thanks

## 2017-08-13 NOTE — Addendum Note (Signed)
Addended by: Rogers Seeds on: 08/13/2017 01:06 PM   Modules accepted: Orders

## 2017-08-13 NOTE — Telephone Encounter (Signed)
I called patient and advised, order has been entered. He will receive call from Dr. La Jara Blas to schedule.

## 2017-08-13 NOTE — Telephone Encounter (Signed)
Patient called and states you discussed nerve conduction studies with him at his last visit. He would like to have those scheduled.  Please advise. OK to enter order?  CB for patient is [phone]

## 2017-09-04 ENCOUNTER — Encounter (INDEPENDENT_AMBULATORY_CARE_PROVIDER_SITE_OTHER): Payer: Self-pay | Admitting: Physical Medicine and Rehabilitation

## 2017-09-04 ENCOUNTER — Ambulatory Visit (INDEPENDENT_AMBULATORY_CARE_PROVIDER_SITE_OTHER): Payer: Medicare Other | Admitting: Physical Medicine and Rehabilitation

## 2017-09-04 ENCOUNTER — Encounter

## 2017-09-04 DIAGNOSIS — R202 Paresthesia of skin: Secondary | ICD-10-CM | POA: Diagnosis not present

## 2017-09-04 NOTE — Progress Notes (Signed)
Christopher Salazar - 67 y.o. male MRN [id number]  Date of birth: July 14, 1950  Office Visit Note: Visit Date: 09/04/2017 PCP: [name], DO Referred by: [name], DO  Subjective: Chief Complaint  Patient presents with  . Right Hand - Pain, Numbness  . Left Hand - Pain, Numbness  . Right Arm - Numbness, Pain  . Left Arm - Numbness, Pain   HPI: Christopher Salazar is a 67 year old gentleman who I recently saw for epidural injection who is maintaining 80% relief from the injection for his low back and hip pain.  He comes in today at the request of Dr. Ophelia Charter for electrodiagnostic study of both upper limbs.  He is a right-hand-dominant gentleman who reports 2 years of progressive worsening pain numbness and tingling and burning pain in both arms and hands particularly in the thumb index and middle digits.  He reports worsening with driving and using the phone as well as laying down.  He also reports pain if he sits for a long period of time.  He has to hang his arms down or put him in a different place as he sleeps to get any sort of relief.  He is losing sleep at night during the pain.  He reports nothing that he is done thus far as help with the symptoms.  He has not had prior electrodiagnostic studies or prior carpal tunnel surgery.  He does have a history of some neck pain but no specific radicular complaints.  He said no specific trauma.   ROS Otherwise per HPI.  Assessment & Plan: Visit Diagnoses:  1. Paresthesia of skin     Plan: No additional findings.  Impression: The above electrodiagnostic study is ABNORMAL and reveals evidence of:  1.  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  While he could have some residual sensory impairment he would likely make fairly good recovery after appropriate decompression.  There is no significant electrodiagnostic evidence  of any other focal nerve entrapment, brachial plexopathy or generalized peripheral neuropathy.   Recommendations: 1.  Follow-up with referring physician. 2.  Continue current management of symptoms. 3.  Suggest surgical evaluation.   Meds & Orders: No orders of the defined types were placed in this encounter.   Orders Placed This Encounter  Procedures  . NCV with EMG (electromyography)    Follow-up: Return for Pt. to Call [facility] office for follow up with Dr. Ophelia Charter.   Procedures: No procedures performed  EMG & NCV Findings: Evaluation of the left median motor and the right median motor nerves showed prolonged distal onset latency (L7.7, R11.0 ms), reduced amplitude (L4.4, R4.0 mV), and decreased conduction velocity (Elbow-Wrist, L41, R40 m/s).  The left median (across palm) sensory nerve showed no response (Wrist) and no response (Palm).  The right median (across palm) sensory nerve showed no response (Palm), prolonged distal peak latency (6.1 ms), and reduced amplitude (1.3 V).  The left ulnar sensory nerve showed reduced amplitude (2.2 V).  All remaining nerves (as indicated in the following tables) were within normal limits.  Left vs. Right side comparison data for the median motor nerve indicates abnormal L-R latency difference (3.3 ms).  The ulnar sensory nerve indicates abnormal L-R amplitude difference (86.9 %).  All remaining left vs. right side differences were within normal limits.    Needle evaluation of the right abductor pollicis brevis muscle showed increased insertional activity, slightly  increased spontaneous activity, and diminished recruitment.  All remaining muscles (as indicated in the following table) showed no evidence of electrical instability.    Impression: The above electrodiagnostic study is ABNORMAL and reveals evidence of:  1.  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  While he could have some residual sensory impairment he would likely make fairly good recovery after appropriate decompression.  There is no significant electrodiagnostic evidence of any other focal nerve entrapment, brachial plexopathy or generalized peripheral neuropathy.   Recommendations: 1.  Follow-up with referring physician. 2.  Continue current management of symptoms. 3.  Suggest surgical evaluation.   Nerve Conduction Studies Anti Sensory Summary Table   Stim Site NR Peak (ms) Norm Peak (ms) P-T Amp (V) Norm P-T Amp Site1 Site2 Delta-P (ms) Dist (cm) Vel (m/s) Norm Vel (m/s)  Left Median Acr Palm Anti Sensory (2nd Digit)  32C  Wrist *NR  <3.6  >10 Wrist Palm  0.0    Palm *NR  <2.0          Right Median Acr Palm Anti Sensory (2nd Digit)  32.8C  Wrist    *6.1 <3.6 *1.3 >10 Wrist Palm  0.0    Palm *NR  <2.0          Left Radial Anti Sensory (Base 1st Digit)  32.4C  Wrist    2.3 <3.1 11.9  Wrist Base 1st Digit 2.3 0.0    Right Radial Anti Sensory (Base 1st Digit)  32.2C  Wrist    2.0 <3.1 6.9  Wrist Base 1st Digit 2.0 0.0        13.6  4.1         Left Ulnar Anti Sensory (5th Digit)  32.5C  Wrist    3.6 <3.7 *2.2 >15.0 Wrist 5th Digit 3.6 14.0 39 >38  Right Ulnar Anti Sensory (5th Digit)  32.6C  Wrist    3.6 <3.7 16.8 >15.0 Wrist 5th Digit 3.6 14.0 39 >38   Motor Summary Table   Stim Site NR Onset (ms) Norm Onset (ms) O-P Amp (mV) Norm O-P Amp Site1 Site2 Delta-0 (ms) Dist (cm) Vel (m/s) Norm Vel (m/s)  Left Median Motor (Abd Poll Brev)  32.5C  Wrist    *7.7 <4.2 *4.4 >5 Elbow Wrist 5.5 22.5 *41 >50  Elbow    13.2  4.1         Right Median Motor (Abd Poll Brev)  32.1C  Wrist    *11.0 <4.2 *4.0 >5 Elbow Wrist 5.3 21.0 *40 >50  Elbow    16.3  4.2         Left Ulnar Motor (Abd Dig Min)  32.6C  Wrist    3.4 <4.2 9.5 >3 B Elbow Wrist 4.1 22.0 54 >53  B Elbow    7.5  8.1  A Elbow B Elbow 1.6 11.0 69 >53  A Elbow    9.1  3.7         Right Ulnar  Motor (Abd Dig Min)  32.1C  Wrist    3.3 <4.2 7.3 >3 B Elbow Wrist 4.0 21.5 54 >53  B Elbow    7.3  7.8  A Elbow B Elbow 1.6 11.0 69 >53  A Elbow    8.9  5.4          EMG   Side Muscle Nerve Root Ins Act Fibs Psw Amp Dur Poly Recrt Int Dennie Bible  Comment  Right Abd Poll Brev Median C8-T1 *Incr *1+ *1+ Nml Nml 0 *Reduced Nml   Right 1stDorInt Ulnar C8-T1 Nml Nml Nml Nml Nml 0 Nml Nml   Right PronatorTeres Median C6-7 Nml Nml Nml Nml Nml 0 Nml Nml   Right Biceps Musculocut C5-6 Nml Nml Nml Nml Nml 0 Nml Nml   Right Deltoid Axillary C5-6 Nml Nml Nml Nml Nml 0 Nml Nml     Nerve Conduction Studies Anti Sensory Left/Right Comparison   Stim Site L Lat (ms) R Lat (ms) L-R Lat (ms) L Amp (V) R Amp (V) L-R Amp (%) Site1 Site2 L Vel (m/s) R Vel (m/s) L-R Vel (m/s)  Median Acr Palm Anti Sensory (2nd Digit)  32C  Wrist  *6.1   *1.3  Wrist Palm     Palm             Radial Anti Sensory (Base 1st Digit)  32.4C  Wrist 2.3 2.0 0.3 11.9 6.9 42.0 Wrist Base 1st Digit     Ulnar Anti Sensory (5th Digit)  32.5C  Wrist 3.6 3.6 0.0 *2.2 16.8 *86.9 Wrist 5th Digit 39 39 0   Motor Left/Right Comparison   Stim Site L Lat (ms) R Lat (ms) L-R Lat (ms) L Amp (mV) R Amp (mV) L-R Amp (%) Site1 Site2 L Vel (m/s) R Vel (m/s) L-R Vel (m/s)  Median Motor (Abd Poll Brev)  32.5C  Wrist *7.7 *11.0 *3.3 *4.4 *4.0 9.1 Elbow Wrist *41 *40 1  Elbow 13.2 16.3 3.1 4.1 4.2 2.4       Ulnar Motor (Abd Dig Min)  32.6C  Wrist 3.4 3.3 0.1 9.5 7.3 23.2 B Elbow Wrist 54 54 0  B Elbow 7.5 7.3 0.2 8.1 7.8 3.7 A Elbow B Elbow 69 69 0  A Elbow 9.1 8.9 0.2 3.7 5.4 31.5          Waveforms:                     Clinical History: MRI LUMBAR SPINE WITHOUT CONTRAST  TECHNIQUE: Multiplanar, multisequence MR imaging of the lumbar spine was performed. No intravenous contrast was administered.  COMPARISON:  None.  FINDINGS: Segmentation:  Standard  Alignment:  Anatomic  Vertebrae:  No worrisome osseous  lesion  Conus medullaris and cauda equina: Conus extends to the L1 level. Conus and cauda equina appear normal.  Paraspinal and other soft tissues: Renal cystic disease, incompletely evaluated.  Disc levels:  L1-L2:  Slight disc desiccation.  No protrusion or impingement.  L2-L3:  Normal disc space.  Mild facet arthropathy.  No impingement.  L3-L4:  Disc desiccation.  Facet arthropathy.  No impingement.  L4-L5: LEFT laminectomy. Central disc extrusion with caudal migration. Facet arthropathy and RIGHT ligamentum flavum hypertrophy. Mild stenosis. RIGHT greater than LEFT L5 nerve root impingement. Disc material extends into the foramen on both sides. Either L4 nerve root could be affected.  L5-S1: Slight loss of disc height. Annular bulging. Facet arthropathy. No impingement.  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 Caudally migrated component. Facet arthropathy and ligamentum flavum hypertrophy. Loss of interspace height with mild stenosis. RIGHT greater than LEFT L5 nerve root impingement; foraminal narrowing could affect either L4 nerve root.  Otherwise unremarkable MRI lumbar spine.   Electronically Signed   By: Elsie Stain M.D.   On: 07/01/2017 09:34   He reports that he has quit smoking. He has never used smokeless tobacco. No results for input(s): HGBA1C, LABURIC in the  last 8760 hours.  Objective:  VS:  HT:    WT:   BMI:     BP:   HR: bpm  TEMP: ( )  RESP:  Physical Exam  Musculoskeletal:  Inspection reveals very mild flattening of the bilateral APB but no atrophy of the bilateral APB or FDI or hand intrinsics. There is no swelling, color changes, allodynia or dystrophic changes. There is 5 out of 5 strength in the bilateral wrist extension, finger abduction and long finger flexion.  There is mild subjective decreased sensation to light touch in median nerve distribution bilaterally.  There is a positive Phalen's test bilaterally.  There is a negative Hoffmann's test bilaterally.    Ortho Exam Imaging: No results found.  Past Medical/Family/Surgical/Social History: Medications & Allergies reviewed per EMR, new medications updated. Patient Active Problem List   Diagnosis Date Noted  . Chronic right-sided low back pain with right-sided sciatica 07/04/2017   Past Medical History:  Diagnosis Date  . Hypertension   . Kidney cysts    History reviewed. No pertinent family history. Past Surgical History:  Procedure Laterality Date  . BACK SURGERY    . EAR CYST EXCISION  11/25/2011   Procedure: CYST REMOVAL;  Surgeon: Melvenia BeamMitchell Gore, MD;  [facility];  Service: ENT;  Laterality: N/A;  . THROAT SURGERY     Social History   Occupational History  . Not on file  Tobacco Use  . Smoking status: Former Games developermoker  . Smokeless tobacco: Never Used  Substance and Sexual Activity  . Alcohol use: No  . Drug use: No  . Sexual activity: Not on file

## 2017-09-04 NOTE — Progress Notes (Signed)
 .  Numeric Pain Rating Scale and Functional Assessment Average Pain 10   In the last MONTH (on 0-10 scale) has pain interfered with the following?  1. General activity like being  able to carry out your everyday physical activities such as walking, climbing stairs, carrying groceries, or moving a chair?  Rating(7)     

## 2017-09-04 NOTE — Procedures (Signed)
EMG & NCV Findings: Evaluation of the left median motor and the right median motor nerves showed prolonged distal onset latency (L7.7, R11.0 ms), reduced amplitude (L4.4, R4.0 mV), and decreased conduction velocity (Elbow-Wrist, L41, R40 m/s).  The left median (across palm) sensory nerve showed no response (Wrist) and no response (Palm).  The right median (across palm) sensory nerve showed no response (Palm), prolonged distal peak latency (6.1 ms), and reduced amplitude (1.3 V).  The left ulnar sensory nerve showed reduced amplitude (2.2 V).  All remaining nerves (as indicated in the following tables) were within normal limits.  Left vs. Right side comparison data for the median motor nerve indicates abnormal L-R latency difference (3.3 ms).  The ulnar sensory nerve indicates abnormal L-R amplitude difference (86.9 %).  All remaining left vs. right side differences were within normal limits.    Needle evaluation of the right abductor pollicis brevis muscle showed increased insertional activity, slightly increased spontaneous activity, and diminished recruitment.  All remaining muscles (as indicated in the following table) showed no evidence of electrical instability.    Impression: The above electrodiagnostic study is ABNORMAL and reveals evidence of:  1.  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  While he could have some residual sensory impairment he would likely make fairly good recovery after appropriate decompression.  There is no significant electrodiagnostic evidence of any other focal nerve entrapment, brachial plexopathy or generalized peripheral neuropathy.   Recommendations: 1.  Follow-up with referring physician. 2.  Continue current management of symptoms. 3.  Suggest surgical evaluation.   Nerve Conduction Studies Anti Sensory Summary Table   Stim Site NR Peak (ms) Norm  Peak (ms) P-T Amp (V) Norm P-T Amp Site1 Site2 Delta-P (ms) Dist (cm) Vel (m/s) Norm Vel (m/s)  Left Median Acr Palm Anti Sensory (2nd Digit)  32C  Wrist *NR  <3.6  >10 Wrist Palm  0.0    Palm *NR  <2.0          Right Median Acr Palm Anti Sensory (2nd Digit)  32.8C  Wrist    *6.1 <3.6 *1.3 >10 Wrist Palm  0.0    Palm *NR  <2.0          Left Radial Anti Sensory (Base 1st Digit)  32.4C  Wrist    2.3 <3.1 11.9  Wrist Base 1st Digit 2.3 0.0    Right Radial Anti Sensory (Base 1st Digit)  32.2C  Wrist    2.0 <3.1 6.9  Wrist Base 1st Digit 2.0 0.0        13.6  4.1         Left Ulnar Anti Sensory (5th Digit)  32.5C  Wrist    3.6 <3.7 *2.2 >15.0 Wrist 5th Digit 3.6 14.0 39 >38  Right Ulnar Anti Sensory (5th Digit)  32.6C  Wrist    3.6 <3.7 16.8 >15.0 Wrist 5th Digit 3.6 14.0 39 >38   Motor Summary Table   Stim Site NR Onset (ms) Norm Onset (ms) O-P Amp (mV) Norm O-P Amp Site1 Site2 Delta-0 (ms) Dist (cm) Vel (m/s) Norm Vel (m/s)  Left Median Motor (Abd Poll Brev)  32.5C  Wrist    *7.7 <4.2 *4.4 >5 Elbow Wrist 5.5 22.5 *41 >50  Elbow    13.2  4.1         Right Median Motor (Abd Poll Brev)  32.1C  Wrist    *11.0 <4.2 *  4.0 >5 Elbow Wrist 5.3 21.0 *40 >50  Elbow    16.3  4.2         Left Ulnar Motor (Abd Dig Min)  32.6C  Wrist    3.4 <4.2 9.5 >3 B Elbow Wrist 4.1 22.0 54 >53  B Elbow    7.5  8.1  A Elbow B Elbow 1.6 11.0 69 >53  A Elbow    9.1  3.7         Right Ulnar Motor (Abd Dig Min)  32.1C  Wrist    3.3 <4.2 7.3 >3 B Elbow Wrist 4.0 21.5 54 >53  B Elbow    7.3  7.8  A Elbow B Elbow 1.6 11.0 69 >53  A Elbow    8.9  5.4          EMG   Side Muscle Nerve Root Ins Act Fibs Psw Amp Dur Poly Recrt Int Dennie Bible Comment  Right Abd Poll Brev Median C8-T1 *Incr *1+ *1+ Nml Nml 0 *Reduced Nml   Right 1stDorInt Ulnar C8-T1 Nml Nml Nml Nml Nml 0 Nml Nml   Right PronatorTeres Median C6-7 Nml Nml Nml Nml Nml 0 Nml Nml   Right Biceps Musculocut C5-6 Nml Nml Nml Nml Nml 0 Nml Nml   Right  Deltoid Axillary C5-6 Nml Nml Nml Nml Nml 0 Nml Nml     Nerve Conduction Studies Anti Sensory Left/Right Comparison   Stim Site L Lat (ms) R Lat (ms) L-R Lat (ms) L Amp (V) R Amp (V) L-R Amp (%) Site1 Site2 L Vel (m/s) R Vel (m/s) L-R Vel (m/s)  Median Acr Palm Anti Sensory (2nd Digit)  32C  Wrist  *6.1   *1.3  Wrist Palm     Palm             Radial Anti Sensory (Base 1st Digit)  32.4C  Wrist 2.3 2.0 0.3 11.9 6.9 42.0 Wrist Base 1st Digit     Ulnar Anti Sensory (5th Digit)  32.5C  Wrist 3.6 3.6 0.0 *2.2 16.8 *86.9 Wrist 5th Digit 39 39 0   Motor Left/Right Comparison   Stim Site L Lat (ms) R Lat (ms) L-R Lat (ms) L Amp (mV) R Amp (mV) L-R Amp (%) Site1 Site2 L Vel (m/s) R Vel (m/s) L-R Vel (m/s)  Median Motor (Abd Poll Brev)  32.5C  Wrist *7.7 *11.0 *3.3 *4.4 *4.0 9.1 Elbow Wrist *41 *40 1  Elbow 13.2 16.3 3.1 4.1 4.2 2.4       Ulnar Motor (Abd Dig Min)  32.6C  Wrist 3.4 3.3 0.1 9.5 7.3 23.2 B Elbow Wrist 54 54 0  B Elbow 7.5 7.3 0.2 8.1 7.8 3.7 A Elbow B Elbow 69 69 0  A Elbow 9.1 8.9 0.2 3.7 5.4 31.5          Waveforms:

## 2017-09-18 ENCOUNTER — Ambulatory Visit (INDEPENDENT_AMBULATORY_CARE_PROVIDER_SITE_OTHER): Payer: Medicare Other | Admitting: Orthopaedic Surgery

## 2017-09-18 ENCOUNTER — Encounter (INDEPENDENT_AMBULATORY_CARE_PROVIDER_SITE_OTHER): Payer: Self-pay | Admitting: Orthopaedic Surgery

## 2017-09-18 VITALS — BP 104/63 | HR 75 | Ht 71.0 in | Wt 264.0 lb

## 2017-09-18 DIAGNOSIS — G8929 Other chronic pain: Secondary | ICD-10-CM

## 2017-09-18 DIAGNOSIS — G5603 Carpal tunnel syndrome, bilateral upper limbs: Secondary | ICD-10-CM

## 2017-09-18 DIAGNOSIS — M5441 Lumbago with sciatica, right side: Secondary | ICD-10-CM

## 2017-09-19 ENCOUNTER — Encounter (INDEPENDENT_AMBULATORY_CARE_PROVIDER_SITE_OTHER): Payer: Self-pay | Admitting: Orthopaedic Surgery

## 2017-09-19 DIAGNOSIS — G5603 Carpal tunnel syndrome, bilateral upper limbs: Secondary | ICD-10-CM | POA: Insufficient documentation

## 2017-09-19 NOTE — Progress Notes (Signed)
Office Visit Note   Patient: Christopher Salazar           Date of Birth: 1951-02-24           MRN: [id number] Visit Date: 09/18/2017              Requested by: Joaquin CourtsFavero, John Patrick, DO [address] [place], [place] [id number] PCP: Joaquin CourtsFavero, John Patrick, DO   Assessment & Plan: Visit Diagnoses:  1. Bilateral carpal tunnel syndrome   2. Chronic right-sided low back pain with right-sided sciatica     Plan: Patient has severe carpal tunnel syndrome bilaterally and is interested with proceeding with surgical intervention.  He like to get both done as quick as possible we discussed at least a 2-week interval between one wrist and the other.  Right side gives him more symptoms we will proceed with outpatient carpal tunnel release on the right hand.  Procedure discussed and he understands it was severe changes he may only get partial relief of his symptoms.  At 1 week postop he can let us know when he like to proceed with the opposite wrist.  EMG nerve conduction velocity results were reviewed and discussed.  Patient would like to proceed with surgical scheduling.  Follow-Up Instructions: No follow-ups on file.   Orders:  No orders of the defined types were placed in this encounter.  No orders of the defined types were placed in this encounter.     Procedures: No procedures performed   Clinical Data: No additional findings.   Subjective: Chief Complaint  Patient presents with  . Right Arm - Pain, Numbness, Follow-up    EMG/NCS review  . Left Arm - Numbness, Pain, Follow-up    HPI 67 year old male returns post EMG for nerve conduction velocities which shows severe bilateral carpal tunnel syndromes slightly worse on the right than left with more symptoms of the right than left hand.  He has been using the splint without improvement on both wrists.  Pain wakes him up at night he drops objects has problems driving.  Patient also has right L4-5 paracentral disc protrusion and got  relief with epidural 07/17/2017.  Turning twisting, mowing the yard, working on all cars all make his problem with his back worse.  Hands are bothering him much more than his back currently.  Patient denies chills or fever no bowel or bladder symptoms.  He denies neck pain.  Review of Systems 14 point review of systems updated unchanged from 06/25/2017 office visit other than as mentioned in HPI.   Objective: Vital Signs: BP 104/63   Pulse 75   Ht 5\' 11"  (1.803 m)   Wt 264 lb (119.7 kg)   BMI 36.82 kg/m   Physical Exam  Constitutional: He is oriented to person, place, and time. He appears well-developed and well-nourished.  HENT:  Head: Normocephalic and atraumatic.  Eyes: Pupils are equal, round, and reactive to light. EOM are normal.  Neck: No tracheal deviation present. No thyromegaly present.  Cardiovascular: Normal rate.  Pulmonary/Chest: Effort normal. He has no wheezes.  Abdominal: Soft. Bowel sounds are normal.  Neurological: He is alert and oriented to person, place, and time.  Skin: Skin is warm and dry. Capillary refill takes less than 2 seconds.  Psychiatric: He has a normal mood and affect. His behavior is normal. Judgment and thought content normal.    Ortho Exam patient has good cervical range of motion no brachial plexus tenderness negative Spurling.  No supra clavicular lymphadenopathy.  Mild thenar atrophy worse on the right than left.  Has some abduction weakness of both right and left.  Decreased sensation radial 3 and half fingers normal sensation ulnar one half fingers both right and left no hyperthenar atrophy.  Specialty Comments:  Impression: The above electrodiagnostic study is ABNORMAL and reveals evidence of:  1.  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  While he could have some residual sensory impairment he would likely make fairly good recovery  after appropriate decompression.  There is no significant electrodiagnostic evidence of any other focal nerve entrapment, brachial plexopathy or generalized peripheral neuropathy.   Recommendations: 1.  Follow-up with referring physician. 2.  Continue current management of symptoms. 3.  Suggest surgical evaluation.     Imaging: No results found.   PMFS History: Patient Active Problem List   Diagnosis Date Noted  . Bilateral carpal tunnel syndrome 09/19/2017  . Chronic right-sided low back pain with right-sided sciatica 07/04/2017   Past Medical History:  Diagnosis Date  . Hypertension   . Kidney cysts     No family history on file.  Past Surgical History:  Procedure Laterality Date  . BACK SURGERY    . EAR CYST EXCISION  11/25/2011   Procedure: CYST REMOVAL;  Surgeon: Melvenia Beam, MD;  Location: [facility] OR;  Service: ENT;  Laterality: N/A;  . THROAT SURGERY     Social History   Occupational History  . Not on file  Tobacco Use  . Smoking status: Former Games developer  . Smokeless tobacco: Never Used  Substance and Sexual Activity  . Alcohol use: No  . Drug use: No  . Sexual activity: Not on file

## 2017-09-29 DIAGNOSIS — G5601 Carpal tunnel syndrome, right upper limb: Secondary | ICD-10-CM

## 2017-10-09 ENCOUNTER — Encounter (INDEPENDENT_AMBULATORY_CARE_PROVIDER_SITE_OTHER): Payer: Self-pay | Admitting: Orthopaedic Surgery

## 2017-10-09 ENCOUNTER — Ambulatory Visit (INDEPENDENT_AMBULATORY_CARE_PROVIDER_SITE_OTHER): Payer: Medicare Other | Admitting: Orthopaedic Surgery

## 2017-10-09 VITALS — BP 138/65 | HR 72 | Ht 71.0 in | Wt 264.0 lb

## 2017-10-09 DIAGNOSIS — G5603 Carpal tunnel syndrome, bilateral upper limbs: Secondary | ICD-10-CM

## 2017-10-09 NOTE — Progress Notes (Signed)
Office Visit Note   Patient: Christopher Salazar           Date of Birth: 04/29/1950           MRN: [id number] Visit Date: 10/09/2017              Requested by: Joaquin CourtsFavero, John Patrick, DO [address] [place], [place] [id number] PCP: Joaquin CourtsFavero, John Patrick, DO   Assessment & Plan: Visit Diagnoses:  1. Bilateral carpal tunnel syndrome     Plan: Patient is doing well post right carpal tunnel release 10 days ago.  He will return next week for nurse visit only for suture removal when he is 2 weeks from surgery.  We will schedule his opposite left hand in a few weeks for outpatient surgery.  Patient is happy with the surgical result with his right carpal tunnel release.  Follow-Up Instructions: No follow-ups on file.   Orders:  No orders of the defined types were placed in this encounter.  No orders of the defined types were placed in this encounter.     Procedures: No procedures performed   Clinical Data: No additional findings.   Subjective: Chief Complaint  Patient presents with  . Right Wrist - Routine Post Op    HPI 67 year old male returns 10 Days Post Carpal Tunnel release on the right hand.  Right hand is doing well good improvement in his pain he states is been able to sleep all the way through the night with the right hand but still having problems with his left hand that has severe carpal tunnel as well.  He like to get the left carpal tunnel procedure done in a few weeks.  Review of Systems Unchanged from last office visit.  Objective: Vital Signs: BP 138/65   Pulse 72   Ht 5\' 11"  (1.803 m)   Wt 264 lb (119.7 kg)   BMI 36.82 kg/m   Physical Exam  Constitutional: He is oriented to person, place, and time. He appears well-developed and well-nourished.  HENT:  Head: Normocephalic and atraumatic.  Eyes: Pupils are equal, round, and reactive to light. EOM are normal.  Neck: No tracheal deviation present. No thyromegaly present.  Cardiovascular: Normal  rate.  Pulmonary/Chest: Effort normal. He has no wheezes.  Abdominal: Soft. Bowel sounds are normal.  Neurological: He is alert and oriented to person, place, and time.  Skin: Skin is warm and dry. Capillary refill takes less than 2 seconds.  Psychiatric: He has a normal mood and affect. His behavior is normal. Judgment and thought content normal.    Ortho Exam right carpal tunnel incision is healing nicely sutures are intact without drainage.  Band-Aid applied.  He has pain with the left carpal compression positive Phalen.  Specialty Comments:  No specialty comments available.  Imaging: No results found.   PMFS History: Patient Active Problem List   Diagnosis Date Noted  . Bilateral carpal tunnel syndrome 09/19/2017  . Chronic right-sided low back pain with right-sided sciatica 07/04/2017   Past Medical History:  Diagnosis Date  . Hypertension   . Kidney cysts     History reviewed. No pertinent family history.  Past Surgical History:  Procedure Laterality Date  . BACK SURGERY    . EAR CYST EXCISION  11/25/2011   Procedure: CYST REMOVAL;  Surgeon: Melvenia BeamMitchell Gore, MD;  [facility];  Service: ENT;  Laterality: N/A;  . THROAT SURGERY     Social History   Occupational History  . Not on file  Tobacco Use  . Smoking status: Former Research scientist (life sciences)  . Smokeless tobacco: Never Used  Substance and Sexual Activity  . Alcohol use: No  . Drug use: No  . Sexual activity: Not on file

## 2017-11-10 DIAGNOSIS — G5602 Carpal tunnel syndrome, left upper limb: Secondary | ICD-10-CM | POA: Diagnosis not present

## 2017-11-10 HISTORY — PX: CARPAL TUNNEL RELEASE: SHX101

## 2017-11-20 ENCOUNTER — Ambulatory Visit (INDEPENDENT_AMBULATORY_CARE_PROVIDER_SITE_OTHER): Payer: Medicare Other | Admitting: Orthopaedic Surgery

## 2017-11-20 ENCOUNTER — Encounter (INDEPENDENT_AMBULATORY_CARE_PROVIDER_SITE_OTHER): Payer: Self-pay | Admitting: Orthopaedic Surgery

## 2017-11-20 VITALS — BP 110/61 | HR 70 | Ht 71.0 in | Wt 263.0 lb

## 2017-11-20 DIAGNOSIS — Z9889 Other specified postprocedural states: Secondary | ICD-10-CM | POA: Insufficient documentation

## 2017-11-20 DIAGNOSIS — G5603 Carpal tunnel syndrome, bilateral upper limbs: Secondary | ICD-10-CM

## 2017-11-20 DIAGNOSIS — M5441 Lumbago with sciatica, right side: Secondary | ICD-10-CM

## 2017-11-20 DIAGNOSIS — G8929 Other chronic pain: Secondary | ICD-10-CM | POA: Diagnosis not present

## 2017-11-20 NOTE — Progress Notes (Signed)
   Post-Op Visit Note   Patient: Christopher Salazar           Date of Birth: 19-Jun-1950           MRN: [id number] Visit Date: 11/20/2017 PCP: Joaquin CourtsFavero, John Patrick, DO   Assessment & Plan: Postop left carpal tunnel release.  Incision looks good he will return in 1 week for suture removal.  He is gotten good relief of his preop symptoms.  Chief Complaint:  Chief Complaint  Patient presents with  . Left Wrist - Routine Post Op    11/10/17 Left Carpal Tunnel Release   Visit Diagnoses:  1. Chronic right-sided low back pain with right-sided sciatica   2. Bilateral carpal tunnel syndrome     Plan: Patient states his back is better after the injection.  He is [name] had carpal tunnel release on the right hand which is doing well.  Return 1 week for left carpal tunnel suture removal.  Follow-Up Instructions: No follow-ups on file.   Orders:  No orders of the defined types were placed in this encounter.  No orders of the defined types were placed in this encounter.   Imaging: No results found.  PMFS History: Patient Active Problem List   Diagnosis Date Noted  . Bilateral carpal tunnel syndrome 09/19/2017  . Chronic right-sided low back pain with right-sided sciatica 07/04/2017   Past Medical History:  Diagnosis Date  . Hypertension   . Kidney cysts     No family history on file.  Past Surgical History:  Procedure Laterality Date  . BACK SURGERY    . CARPAL TUNNEL RELEASE Left 11/10/2017  . EAR CYST EXCISION  11/25/2011   Procedure: CYST REMOVAL;  Surgeon: Melvenia BeamMitchell Gore, MD;  [facility];  Service: ENT;  Laterality: N/A;  . THROAT SURGERY     Social History   Occupational History  . Not on file  Tobacco Use  . Smoking status: Former Games developermoker  . Smokeless tobacco: Never Used  Substance and Sexual Activity  . Alcohol use: No  . Drug use: No  . Sexual activity: Not on file

## 2017-11-27 ENCOUNTER — Encounter (INDEPENDENT_AMBULATORY_CARE_PROVIDER_SITE_OTHER): Payer: Self-pay | Admitting: Orthopaedic Surgery

## 2017-11-27 ENCOUNTER — Ambulatory Visit (INDEPENDENT_AMBULATORY_CARE_PROVIDER_SITE_OTHER): Payer: Medicare Other | Admitting: Orthopaedic Surgery

## 2017-11-27 VITALS — BP 115/74 | HR 75 | Ht 71.0 in | Wt 263.0 lb

## 2017-11-27 DIAGNOSIS — Z9889 Other specified postprocedural states: Secondary | ICD-10-CM

## 2017-11-27 NOTE — Progress Notes (Signed)
   Post-Op Visit Note   Patient: Christopher Salazar           Date of Birth: 10/12/1950           MRN: [id number] Visit Date: 11/27/2017 PCP: [name], DO   Assessment & Plan: Post left carpal tunnel release.  Sutures removed incision looks good good relief of preoperative pain he has had both carpal tunnel releases done at this point.  He still has some back problems but got better with the epidural and will return if he has recurrent symptoms he is happy with the results of treatment is sleeping through the night with good resolution of hand numbness and pain.  Chief Complaint:  Chief Complaint  Patient presents with  . Left Wrist - Follow-up    11/10/17 Left CTR   Visit Diagnoses:  1. S/P carpal tunnel release     Plan: as Above.  Follow-Up Instructions: Return if symptoms worsen or fail to improve.   Orders:  No orders of the defined types were placed in this encounter.  No orders of the defined types were placed in this encounter.   Imaging: No results found.  PMFS History: Patient Active Problem List   Diagnosis Date Noted  . S/P carpal tunnel release 11/20/2017  . Chronic right-sided low back pain with right-sided sciatica 07/04/2017   Past Medical History:  Diagnosis Date  . Hypertension   . Kidney cysts     No family history on file.  Past Surgical History:  Procedure Laterality Date  . BACK SURGERY    . CARPAL TUNNEL RELEASE Left 11/10/2017  . EAR CYST EXCISION  11/25/2011   Procedure: CYST REMOVAL;  Surgeon: Melvenia Beam, MD;  Location: [facility];  Service: ENT;  Laterality: N/A;  . THROAT SURGERY     Social History   Occupational History  . Not on file  Tobacco Use  . Smoking status: Former Games developer  . Smokeless tobacco: Never Used  Substance and Sexual Activity  . Alcohol use: No  . Drug use: No  . Sexual activity: Not on file

## 2018-07-23 ENCOUNTER — Ambulatory Visit (INDEPENDENT_AMBULATORY_CARE_PROVIDER_SITE_OTHER): Payer: Medicare Other | Admitting: Orthopaedic Surgery

## 2018-07-23 ENCOUNTER — Other Ambulatory Visit: Payer: Self-pay

## 2018-07-23 ENCOUNTER — Encounter (INDEPENDENT_AMBULATORY_CARE_PROVIDER_SITE_OTHER): Payer: Self-pay | Admitting: Orthopaedic Surgery

## 2018-07-23 VITALS — Ht 71.0 in | Wt 261.0 lb

## 2018-07-23 DIAGNOSIS — M5116 Intervertebral disc disorders with radiculopathy, lumbar region: Secondary | ICD-10-CM | POA: Diagnosis not present

## 2018-07-23 DIAGNOSIS — G8929 Other chronic pain: Secondary | ICD-10-CM

## 2018-07-23 DIAGNOSIS — M5441 Lumbago with sciatica, right side: Secondary | ICD-10-CM

## 2018-07-23 MED ORDER — OXYCODONE-ACETAMINOPHEN 5-325 MG PO TABS: 1.0000 | ORAL_TABLET | Freq: Four times a day (QID) | ORAL | 0 refills | Status: DC | PRN

## 2018-07-23 NOTE — Progress Notes (Signed)
Office Visit Note   Patient: Christopher Salazar           Date of Birth: 1950-09-19           MRN: [id number] Visit Date: 07/23/2018              Requested by: [name], DO [address] [place], [place] [address] PCP: [name], DO   Assessment & Plan: Visit Diagnoses:  1. Lumbar disc herniation with radiculopathy   2. Chronic right-sided low back pain with right-sided sciatica     Plan: Percocet prescription given 30tablets.  He lives in [place] but he requested it be sent to [facility] in [place].  We will set him up for repeat epidural like he had with Dr. Alvester Morin last year at the L4-5 level for his right radicular symptoms.  He is not improved he will need new MRI scan wants to proceed with operative microdiscectomy.  Hopefully the epidural will settle his symptoms down.  Follow-Up Instructions: No follow-ups on file.   Orders:  No orders of the defined types were placed in this encounter.  Meds ordered this encounter  Medications  . oxyCODONE-acetaminophen (PERCOCET/ROXICET) 5-325 MG tablet    Sig: Take 1-2 tablets by mouth every 6 (six) hours as needed for severe pain.    Dispense:  30 tablet    Refill:  0      Procedures: No procedures performed   Clinical Data: No additional findings.   Subjective: Chief Complaint  Patient presents with  . Lower Back - Pain, Follow-up    HPI 68 year old male returns with severe back pain right leg pain states he feels like his foot is on fire burning on top and bottom.  He was lifting heavy rocks in the yard and suddenly felt sharp excruciating back pain and right leg pain.  He went to the ER on Sunday was given some injection of prednisone given oxycodone gabapentin is used up all the oxycodone had 10 tablets.  Previous left lumbar Lam L4-5 done by Dr. Jean Rosenthal in [place] 35 years ago.  Previous MRI 1 year ago 2019 showed central disc extrusion with narrowing right paracentral and some caudal  migration.  Patient had been doing well after epidural last year he got some good relief.  He is had a couple flareups but nothing is severe as this Saturday.  Wife is with him today.  Review of Systems 14 point review of systems unchanged from my previous office visit.   Objective: Vital Signs: Ht 5\' 11"  (1.803 m)   Wt 261 lb (118.4 kg)   BMI 36.40 kg/m   Physical Exam Constitutional:      Appearance: He is well-developed.  HENT:     Head: Normocephalic and atraumatic.  Eyes:     Pupils: Pupils are equal, round, and reactive to light.  Neck:     Thyroid: No thyromegaly.     Trachea: No tracheal deviation.  Cardiovascular:     Rate and Rhythm: Normal rate.  Pulmonary:     Effort: Pulmonary effort is normal.     Breath sounds: No wheezing.  Abdominal:     General: Bowel sounds are normal.     Palpations: Abdomen is soft.  Skin:    General: Skin is warm and dry.     Capillary Refill: Capillary refill takes less than 2 seconds.  Neurological:     Mental Status: He is alert and oriented to person, place, and time.  Psychiatric:  Behavior: Behavior normal.        Thought Content: Thought content normal.        Judgment: Judgment normal.     Ortho Exam patient has intact ankle dorsiflexion plantarflexion no atrophy pulses normal.  He has severe pain with straight leg raising on the right at 70 degrees negative on the left.  Sciatic notch tenderness.  Patient has great difficulty standing severe problems walking.  Specialty Comments:  No specialty comments available.  Imaging: No results found.   PMFS History: Patient Active Problem List   Diagnosis Date Noted  . Lumbar disc herniation with radiculopathy 07/23/2018  . S/P carpal tunnel release 11/20/2017  . Chronic right-sided low back pain with right-sided sciatica 07/04/2017   Past Medical History:  Diagnosis Date  . Hypertension   . Kidney cysts     No family history on file.  Past Surgical History:   Procedure Laterality Date  . BACK SURGERY    . CARPAL TUNNEL RELEASE Left 11/10/2017  . EAR CYST EXCISION  11/25/2011   Procedure: CYST REMOVAL;  Surgeon: Melvenia BeamMitchell Gore, MD;  [facility];  Service: ENT;  Laterality: N/A;  . THROAT SURGERY     Social History   Occupational History  . Not on file  Tobacco Use  . Smoking status: Former Games developermoker  . Smokeless tobacco: Never Used  Substance and Sexual Activity  . Alcohol use: No  . Drug use: No  . Sexual activity: Not on file

## 2018-07-23 NOTE — Addendum Note (Signed)
Addended by: Rogers Seeds on: 07/23/2018 10:23 AM   Modules accepted: Orders

## 2018-07-29 ENCOUNTER — Other Ambulatory Visit: Payer: Self-pay

## 2018-07-29 ENCOUNTER — Ambulatory Visit: Payer: Self-pay

## 2018-07-29 ENCOUNTER — Ambulatory Visit (INDEPENDENT_AMBULATORY_CARE_PROVIDER_SITE_OTHER): Payer: Medicare Other | Admitting: Physical Medicine and Rehabilitation

## 2018-07-29 ENCOUNTER — Encounter: Payer: Self-pay | Admitting: Physical Medicine and Rehabilitation

## 2018-07-29 VITALS — BP 109/66 | HR 63

## 2018-07-29 DIAGNOSIS — M5116 Intervertebral disc disorders with radiculopathy, lumbar region: Secondary | ICD-10-CM

## 2018-07-29 DIAGNOSIS — M5416 Radiculopathy, lumbar region: Secondary | ICD-10-CM

## 2018-07-29 DIAGNOSIS — M961 Postlaminectomy syndrome, not elsewhere classified: Secondary | ICD-10-CM

## 2018-07-29 MED ORDER — BETAMETHASONE SOD PHOS & ACET 6 (3-3) MG/ML IJ SUSP
12.0000 mg | Freq: Once | INTRAMUSCULAR | Status: AC
  Administered 2018-07-29: 12 mg

## 2018-07-29 NOTE — Progress Notes (Signed)
Numeric Pain Rating Scale and Functional Assessment Average Pain 10   In the last MONTH (on 0-10 scale) has pain interfered with the following?  1. General activity like being  able to carry out your everyday physical activities such as walking, climbing stairs, carrying groceries, or moving a chair?  Rating(10)   +Driver, -BT, -Dye Allergies. 

## 2018-07-29 NOTE — Procedures (Signed)
Lumbosacral Transforaminal Epidural Steroid Injection - Sub-Pedicular Approach with Fluoroscopic Guidance  Patient: Christopher Salazar      Date of Birth: 1950/11/05 MRN: [id number] PCP: [name], DO      Visit Date: 07/29/2018   Universal Protocol:    Date/Time: 07/29/2018  Consent Given By: the patient  Position: PRONE  Additional Comments: Vital signs were monitored before and after the procedure. Patient was prepped and draped in the usual sterile fashion. The correct patient, procedure, and site was verified.   Injection Procedure Details:  Procedure Site One Meds Administered:  Meds ordered this encounter  Medications  . betamethasone acetate-betamethasone sodium phosphate (CELESTONE) injection 12 mg    Laterality: Right  Location/Site:  L4-L5 L5-S1  Needle size: 22 G  Needle type: Spinal  Needle Placement: Transforaminal  Findings:    -Comments: Excellent flow of contrast along the nerve and into the epidural space.  Procedure Details: After squaring off the end-plates to get a true AP view, the C-arm was positioned so that an oblique view of the foramen as noted above was visualized. The target area is just inferior to the "nose of the scotty dog" or sub pedicular. The soft tissues overlying this structure were infiltrated with 2-3 ml. of 1% Lidocaine without Epinephrine.  The spinal needle was inserted toward the target using a "trajectory" view along the fluoroscope beam.  Under AP and lateral visualization, the needle was advanced so it did not puncture dura and was located close the 6 O'Clock position of the pedical in AP tracterory. Biplanar projections were used to confirm position. Aspiration was confirmed to be negative for CSF and/or blood. A 1-2 ml. volume of Isovue-250 was injected and flow of contrast was noted at each level. Radiographs were obtained for documentation purposes.   After attaining the desired flow of contrast documented  above, a 0.5 to 1.0 ml test dose of 0.25% Marcaine was injected into each respective transforaminal space.  The patient was observed for 90 seconds post injection.  After no sensory deficits were reported, and normal lower extremity motor function was noted,   the above injectate was administered so that equal amounts of the injectate were placed at each foramen (level) into the transforaminal epidural space.   Additional Comments:  The patient tolerated the procedure well Dressing: 2 x 2 sterile gauze and Band-Aid    Post-procedure details: Patient was observed during the procedure. Post-procedure instructions were reviewed.  Patient left the clinic in stable condition.

## 2018-07-31 NOTE — Progress Notes (Signed)
Christopher Salazar - 68 y.o. male MRN [id number]  Date of birth: Apr 12, 1950  Office Visit Note: Visit Date: 07/29/2018 PCP: Joaquin CourtsFavero, John Patrick, DO Referred by: Joaquin CourtsFavero, John Patrick, DO  Subjective: Chief Complaint  Patient presents with  . Lower Back - Pain   HPI:  Christopher Salazar is a [age] y.o. male who comes in today At the request of Dr. Annell GreeningMark Yates for repeat lumbar epidural injection.  Patient had prior right L4 and L5 transforaminal epidural steroid injection about a year ago with really good relief up until recently.  Please review Dr. Ophelia CharterYates notes for current clinical situation.  He is again having right radicular leg pain more than L5 distribution in the foot.  He really feels like the paresthesias into the foot is really the worst problem more than his back.  He had a history of prior left laminectomy at L4-5 with recurrent disc extrusion at L4-5 and at the time MRI findings from 2019 showing foraminal component of the disc extrusion bilaterally along with lateral recess component of caudal extension more right than left.  I do think it is appropriate for right L4 and L5 repeat transforaminal injection but this would also be diagnostic as that was a year ago.  ROS Otherwise per HPI.  Assessment & Plan: Visit Diagnoses:  1. Lumbar radiculopathy   2. Radiculopathy due to lumbar intervertebral disc disorder   3. Post laminectomy syndrome     Plan: No additional findings.   Meds & Orders:  Meds ordered this encounter  Medications  . betamethasone acetate-betamethasone sodium phosphate (CELESTONE) injection 12 mg    Orders Placed This Encounter  Procedures  . XR C-ARM NO REPORT  . Epidural Steroid injection    Follow-up: Return if symptoms worsen or fail to improve, for Annell GreeningMark Yates, MD.   Procedures: No procedures performed  Lumbosacral Transforaminal Epidural Steroid Injection - Sub-Pedicular Approach with Fluoroscopic Guidance  Patient: Christopher Salazar      Date  of Birth: Apr 12, 1950 MRN: [id number] PCP: Joaquin CourtsFavero, John Patrick, DO      Visit Date: 07/29/2018   Universal Protocol:    Date/Time: 07/29/2018  Consent Given By: the patient  Position: PRONE  Additional Comments: Vital signs were monitored before and after the procedure. Patient was prepped and draped in the usual sterile fashion. The correct patient, procedure, and site was verified.   Injection Procedure Details:  Procedure Site One Meds Administered:  Meds ordered this encounter  Medications  . betamethasone acetate-betamethasone sodium phosphate (CELESTONE) injection 12 mg    Laterality: Right  Location/Site:  L4-L5 L5-S1  Needle size: 22 G  Needle type: Spinal  Needle Placement: Transforaminal  Findings:    -Comments: Excellent flow of contrast along the nerve and into the epidural space.  Procedure Details: After squaring off the end-plates to get a true AP view, the C-arm was positioned so that an oblique view of the foramen as noted above was visualized. The target area is just inferior to the "nose of the scotty dog" or sub pedicular. The soft tissues overlying this structure were infiltrated with 2-3 ml. of 1% Lidocaine without Epinephrine.  The spinal needle was inserted toward the target using a "trajectory" view along the fluoroscope beam.  Under AP and lateral visualization, the needle was advanced so it did not puncture dura and was located close the 6 O'Clock position of the pedical in AP tracterory. Biplanar projections were used to confirm position. Aspiration was confirmed to be negative  for CSF and/or blood. A 1-2 ml. volume of Isovue-250 was injected and flow of contrast was noted at each level. Radiographs were obtained for documentation purposes.   After attaining the desired flow of contrast documented above, a 0.5 to 1.0 ml test dose of 0.25% Marcaine was injected into each respective transforaminal space.  The patient was observed for 90 seconds  post injection.  After no sensory deficits were reported, and normal lower extremity motor function was noted,   the above injectate was administered so that equal amounts of the injectate were placed at each foramen (level) into the transforaminal epidural space.   Additional Comments:  The patient tolerated the procedure well Dressing: 2 x 2 sterile gauze and Band-Aid    Post-procedure details: Patient was observed during the procedure. Post-procedure instructions were reviewed.  Patient left the clinic in stable condition.       Clinical History: MRI LUMBAR SPINE WITHOUT CONTRAST  TECHNIQUE: Multiplanar, multisequence MR imaging of the lumbar spine was performed. No intravenous contrast was administered.  COMPARISON:  None.  FINDINGS: Segmentation:  Standard  Alignment:  Anatomic  Vertebrae:  No worrisome osseous lesion  Conus medullaris and cauda equina: Conus extends to the L1 level. Conus and cauda equina appear normal.  Paraspinal and other soft tissues: Renal cystic disease, incompletely evaluated.  Disc levels:  L1-L2:  Slight disc desiccation.  No protrusion or impingement.  L2-L3:  Normal disc space.  Mild facet arthropathy.  No impingement.  L3-L4:  Disc desiccation.  Facet arthropathy.  No impingement.  L4-L5: LEFT laminectomy. Central disc extrusion with caudal migration. Facet arthropathy and RIGHT ligamentum flavum hypertrophy. Mild stenosis. RIGHT greater than LEFT L5 nerve root impingement. Disc material extends into the foramen on both sides. Either L4 nerve root could be affected.  L5-S1: Slight loss of disc height. Annular bulging. Facet arthropathy. No impingement.  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 Caudally migrated component. Facet arthropathy and ligamentum flavum hypertrophy. Loss of interspace height with mild stenosis. RIGHT greater than LEFT L5 nerve root impingement; foraminal narrowing could  affect either L4 nerve root.  Otherwise unremarkable MRI lumbar spine.   Electronically Signed   By: Elsie Stain M.D.   On: 07/01/2017 09:34     Objective:  VS:  HT:    WT:   BMI:     BP:109/66  HR:63bpm  TEMP: ( )  RESP:96 % Physical Exam  Ortho Exam Imaging: No results found.

## 2018-08-13 ENCOUNTER — Other Ambulatory Visit: Payer: Self-pay | Admitting: Sports Medicine

## 2018-08-13 DIAGNOSIS — M545 Low back pain, unspecified: Secondary | ICD-10-CM

## 2018-08-22 ENCOUNTER — Other Ambulatory Visit: Payer: Self-pay

## 2018-08-22 ENCOUNTER — Ambulatory Visit
Admission: RE | Admit: 2018-08-22 | Discharge: 2018-08-22 | Disposition: A | Discharge status: Home / Self Care | Payer: Medicare Other | Source: Ambulatory Visit | Attending: Sports Medicine | Admitting: Sports Medicine

## 2018-08-22 DIAGNOSIS — M545 Low back pain, unspecified: Secondary | ICD-10-CM

## 2018-08-27 ENCOUNTER — Ambulatory Visit (INDEPENDENT_AMBULATORY_CARE_PROVIDER_SITE_OTHER): Payer: Medicare Other | Admitting: Orthopaedic Surgery

## 2018-08-27 ENCOUNTER — Encounter: Payer: Self-pay | Admitting: Orthopaedic Surgery

## 2018-08-27 VITALS — Ht 71.0 in | Wt 258.0 lb

## 2018-08-27 DIAGNOSIS — M5116 Intervertebral disc disorders with radiculopathy, lumbar region: Secondary | ICD-10-CM

## 2018-08-27 DIAGNOSIS — G8929 Other chronic pain: Secondary | ICD-10-CM

## 2018-08-27 DIAGNOSIS — M5441 Lumbago with sciatica, right side: Secondary | ICD-10-CM

## 2018-08-27 DIAGNOSIS — M21371 Foot drop, right foot: Secondary | ICD-10-CM | POA: Diagnosis not present

## 2018-08-27 NOTE — Progress Notes (Addendum)
Office Visit Note   Patient: Christopher Salazar           Date of Birth: [birth year]           MRN: [id number] Visit Date: 08/27/2018              Requested by: Joaquin CourtsFavero, John Patrick, DO [address] [place], [place] [id number] PCP: Joaquin CourtsFavero, John Patrick, DO   Assessment & Plan: Visit Diagnoses:  1. Lumbar disc herniation with radiculopathy   2. Chronic right-sided low back pain with right-sided sciatica   3. Acquired right foot drop     Plan: Patient has recurrent disc protrusion right paracentral at L4-5 with partial foot drop and increased weakness.  He is failed prednisone Dosepak pain medication has been using oxycodone for pain, baclofen, gabapentin.  He has had previous epidural and has persistent radicular symptoms.  Plan will be microdiscectomy for recurrent HNP overnight stay in the hospital.  We discussed risks of surgery including risk of recurrent disc herniation quoted at 5 %, possibility of Intra-Op dural tear, potential for progression of degeneration and need for fusion.  Questions were elicited and answered he understands and requests we proceed.  Patient's wife was present today and included in the discussion.  Follow-Up Instructions: No follow-ups on file.   Orders:  No orders of the defined types were placed in this encounter.  No orders of the defined types were placed in this encounter.     Procedures: No procedures performed   Clinical Data: No additional findings.   Subjective: Chief Complaint  Patient presents with  . Lower Back - Pain, Follow-up    HPI 68 year old male seen at the request of Dr. Cleophas DunkerBassett for L4-5 HNP which is a recurrent disc herniation with radiculopathy.  Patient's had symptoms since the beginning of April with back pain right leg pain and partial foot drop.  Onset of symptoms was when he was lifting heavy rocks in the yard and suddenly felt sharp excruciating back pain right leg pain with right leg weakness.  He was treated  with prednisone, OxyContin, gabapentin.  [facility] [place] [facility] [place] [place] Dr. Jean RosenthalJackson 35 years ago.  MRI scan when I saw him in 2019 showed central disc extrusion with right paracentral and caudal migration with lateral recess compression.  Patient had an epidural injection by Dr. Alvester MorinNewton on 07/29/2018 with some improvement in his back pain but he still has right leg weakness difficulty walking is been catching his toe and has burning and stinging in his leg and foot on the right side only.  Patient states something needs to be done and he wants to proceed with operative treatment at this time.  Review of Systems patient's had past history of hypertension.  Previous left carpal tunnel release by me August 2019 doing well.  History of kidney cyst without malignancy.  He had ear cyst excision 2013.  Patient is a former smoker.  Does not drink no drug use.   Objective: Vital Signs: Ht 5\' 11"  (1.803 m)   Wt 258 lb (117 kg)   BMI 35.98 kg/m   Physical Exam Constitutional:      Appearance: He is well-developed.  HENT:     Head: Normocephalic and atraumatic.  Eyes:     Pupils: Pupils are equal, round, and reactive to light.  Neck:     Thyroid: No thyromegaly.     Trachea: No tracheal deviation.  Cardiovascular:     Rate and Rhythm:  Normal rate.  Pulmonary:     Effort: Pulmonary effort is normal.     Breath sounds: No wheezing.  Abdominal:     General: Bowel sounds are normal.     Palpations: Abdomen is soft.  Skin:    General: Skin is warm and dry.     Capillary Refill: Capillary refill takes less than 2 seconds.  Neurological:     Mental Status: He is alert and oriented to person, place, and time.  Psychiatric:        Behavior: Behavior normal.        Thought Content: Thought content normal.        Judgment: Judgment normal.     Ortho Exam patient has well-healed lumbar incision at L4-5.  Sciatic notch tenderness on the right positive straight leg raising at  60 degrees.  Anterior tib is weak able to be overcome with 2 finger pressure.  EHL on the right is 3+ out of 5 with significant weakness.  Opposite left ankle shows normal dorsiflexion plantarflexion strength.  Patient cannot heel walk on the right he is able to toe walk.  Specialty Comments:  No specialty comments available.  Imaging: CLINICAL DATA:  Low back pain radiating to the right foot and lower leg for 5 weeks since an injury doing yard work. Initial encounter.  EXAM: MRI LUMBAR SPINE WITHOUT CONTRAST  TECHNIQUE: Multiplanar, multisequence MR imaging of the lumbar spine was performed. No intravenous contrast was administered.  COMPARISON:  Lumbar spine 07/01/2017.  FINDINGS: Segmentation:  Standard.  Alignment:  Maintained.  Vertebrae:  No fracture or worrisome lesion.  Conus medullaris and cauda equina: Conus extends to the L1 level. Conus and cauda equina appear normal.  Paraspinal and other soft tissues: T2 hyperintense lesions in the kidneys are partially visualized but appear unchanged. Otherwise negative.  Disc levels:  T10-11 and T11-12 are imaged in the sagittal plane only and negative.  T12-L1: Negative.  L1-2: Negative.  L2-3: Mild facet degenerative change.  Otherwise negative.  L3-4: Mild facet degenerative change.  Otherwise negative.  L4-5: Left laminectomy defect as seen on the prior exam. Broad-based right paracentral protrusion with some caudal extension is again seen. Mild narrowing is seen in the subarticular recesses. There is left worse than right foraminal narrowing due to disc and facet degenerative change. The appearance of this level is stable.  L5-S1: Prominent epidural.  Mild disc bulge without stenosis.  IMPRESSION: No change in the appearance of the lumbar spine. No new abnormality since prior exam.  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 Broad-based central  protrusion causes mild narrowing in the subarticular recesses and left worse than right foraminal narrowing.   Electronically Signed   By: Drusilla Kanner M.D.   On: 08/22/2018 11:04    PMFS History: Patient Active Problem List   Diagnosis Date Noted  . Lumbar disc herniation with radiculopathy 07/23/2018  . S/P carpal tunnel release 11/20/2017  . Chronic right-sided low back pain with right-sided sciatica 07/04/2017   Past Medical History:  Diagnosis Date  . Hypertension   . Kidney cysts     No family history on file.  Past Surgical History:  Procedure Laterality Date  . BACK SURGERY    . CARPAL TUNNEL RELEASE Left 11/10/2017  . EAR CYST EXCISION  11/25/2011   Procedure: CYST REMOVAL;  Surgeon: Melvenia Beam, MD;  Location: [facility] OR;  Service: ENT;  Laterality: N/A;  . THROAT SURGERY  Social History   Occupational History  . Not on file  Tobacco Use  . Smoking status: Former Research scientist (life sciences)  . Smokeless tobacco: Never Used  Substance and Sexual Activity  . Alcohol use: No  . Drug use: No  . Sexual activity: Not on file

## 2018-09-05 ENCOUNTER — Other Ambulatory Visit: Payer: Medicare Other

## 2018-09-07 ENCOUNTER — Other Ambulatory Visit: Payer: Self-pay

## 2018-09-09 NOTE — Pre-Procedure Instructions (Addendum)
Christopher BridgeRickey L Salazar  09/09/2018     [id number] Christopher Salazar- [place] [address] [facility] [address] [place] [id number] Phone: [phone] Fax: [phone]  [facility] [address] - [place], [place] - [address] [place] [id number] Phone: [phone] Fax: [phone]   Your procedure is scheduled on Monday June 22nd  [facility] at 1:00 P.M.  Call this number if you have problems the morning of surgery:  [phone]   Remember:  Do not eat after midnight on June 21st.  You may drink clear liquids until 4:30 A.M.  Clear liquids allowed are:    Water, Juice (non-citric and without pulp), Carbonated beverages, Clear Tea, Black Coffee only, Plain Jell-O only, Gatorade and Plain Popsicles only   Please complete your PRE-SURGERY ENSURE that was provided to you by 4:30 A.M.  Please, if able, drink it in one setting. DO NOT SIP.   Take these medicines the morning of surgery with A SIP OF WATER allopurinol (ZYLOPRIM), atorvastatin (LIPITOR) tamsulosin (FLOMAX)  As of today, STOP taking any Aspirin (unless otherwise instructed by your surgeon), other aspirin containing products, Aleve, Naproxen, Ibuprofen, Motrin, Advil, Goody's, BC's, all herbal medications, fish oil, and all vitamins.   Special instructions:   Christopher Salazar- Preparing For Surgery  Before surgery, you can play an important role. Because skin is not sterile, your skin needs to be as free of germs as possible. You can reduce the number of germs on your skin by washing with CHG (chlorahexidine gluconate) Soap before surgery.  CHG is an antiseptic cleaner which kills germs and bonds with the skin to continue killing germs even after washing.    Oral Hygiene is also important to reduce your risk of infection.  Remember - BRUSH YOUR TEETH THE MORNING OF SURGERY WITH YOUR REGULAR TOOTHPASTE  Please do not use if you have an allergy to CHG or antibacterial  soaps. If your skin becomes reddened/irritated stop using the CHG.  Do not shave (including legs and underarms) for at least 48 hours prior to first CHG shower. It is OK to shave your face.  Please follow these instructions carefully.   1. Shower the NIGHT BEFORE SURGERY and the MORNING OF SURGERY with CHG.   2. If you chose to wash your hair, wash your hair first as usual with your normal shampoo.  3. After you shampoo, rinse your hair and body thoroughly to remove the shampoo.  4. Use CHG as you would any other liquid soap. You can apply CHG directly to the skin and wash gently with a scrungie or a clean washcloth.   5. Apply the CHG Soap to your body ONLY FROM THE NECK DOWN.  Do not use on open wounds or open sores. Avoid contact with your eyes, ears, mouth and genitals (private parts). Wash Face and genitals (private parts)  with your normal soap.  6. Wash thoroughly, paying special attention to the area where your surgery will be performed.  7. Thoroughly rinse your body with warm water from the neck down.  8. DO NOT shower/wash with your normal soap after using and rinsing off the CHG Soap.  9. Pat yourself dry with a CLEAN TOWEL.  10. Wear CLEAN PAJAMAS to bed the night before surgery, wear comfortable clothes the morning of surgery  11. Place CLEAN SHEETS on your bed the night of your first shower and DO NOT SLEEP WITH PETS.  Day of Surgery: Do not wear jewelry, make-up or nail polish.  Do not wear lotions, powders, or perfumes/colgne, or deodorant.  Do not shave 48 hours prior to surgery.    Do not bring valuables to the hospital.  [facility] is not responsible for any belongings or valuables.  Please wear clean clothes to the hospital/surgery center.   Remember to brush your teeth WITH YOUR REGULAR TOOTHPASTE.  Contacts, dentures or bridgework may not be worn into surgery.  Leave your suitcase in the car.  After surgery it may be brought to your room.  For patients  admitted to the hospital, discharge time will be determined by your treatment team.  Patients discharged the day of surgery will not be allowed to drive home.   Please read over the following fact sheets that you were given. Pain Booklet, Coughing and Deep Breathing, MRSA Information and Surgical Site Infection Prevention

## 2018-09-10 ENCOUNTER — Other Ambulatory Visit: Payer: Self-pay

## 2018-09-10 ENCOUNTER — Other Ambulatory Visit (HOSPITAL_COMMUNITY)
Admission: RE | Admit: 2018-09-10 | Discharge: 2018-09-10 | Disposition: A | Discharge status: Home / Self Care | Payer: Medicare Other | Source: Ambulatory Visit | Attending: Orthopaedic Surgery | Admitting: Orthopaedic Surgery

## 2018-09-10 ENCOUNTER — Encounter (HOSPITAL_COMMUNITY)
Admission: RE | Admit: 2018-09-10 | Discharge: 2018-09-10 | Disposition: A | Discharge status: Home / Self Care | Payer: Medicare Other | Source: Ambulatory Visit | Attending: Orthopaedic Surgery | Admitting: Orthopaedic Surgery

## 2018-09-10 ENCOUNTER — Ambulatory Visit (HOSPITAL_COMMUNITY)
Admission: RE | Admit: 2018-09-10 | Discharge: 2018-09-10 | Disposition: A | Discharge status: Home / Self Care | Payer: Medicare Other | Source: Ambulatory Visit | Attending: Surgery | Admitting: Surgery

## 2018-09-10 ENCOUNTER — Encounter (HOSPITAL_COMMUNITY): Payer: Self-pay

## 2018-09-10 DIAGNOSIS — Z01818 Encounter for other preprocedural examination: Secondary | ICD-10-CM

## 2018-09-10 DIAGNOSIS — Z1159 Encounter for screening for other viral diseases: Secondary | ICD-10-CM | POA: Diagnosis not present

## 2018-09-10 HISTORY — DX: Pure hypercholesterolemia, unspecified: E78.00

## 2018-09-10 HISTORY — DX: Benign prostatic hyperplasia without lower urinary tract symptoms: N40.0

## 2018-09-10 HISTORY — DX: Pneumonia, unspecified organism: J18.9

## 2018-09-10 HISTORY — DX: Gastro-esophageal reflux disease without esophagitis: K21.9

## 2018-09-10 HISTORY — DX: Gout, unspecified: M10.9

## 2018-09-10 LAB — CBC
HCT: 46.4 % (ref 39.0–52.0)
Hemoglobin: 16.2 g/dL (ref 13.0–17.0)
MCH: 31.1 pg (ref 26.0–34.0)
MCHC: 34.9 g/dL (ref 30.0–36.0)
MCV: 89.1 fL (ref 80.0–100.0)
Platelets: 285 10*3/uL (ref 150–400)
RBC: 5.21 MIL/uL (ref 4.22–5.81)
RDW: 15.6 % — ABNORMAL HIGH (ref 11.5–15.5)
WBC: 11.5 10*3/uL — ABNORMAL HIGH (ref 4.0–10.5)
nRBC: 0 % (ref 0.0–0.2)

## 2018-09-10 LAB — COMPREHENSIVE METABOLIC PANEL
ALT: 55 U/L — ABNORMAL HIGH (ref 0–44)
AST: 39 U/L (ref 15–41)
Albumin: 4.1 g/dL (ref 3.5–5.0)
Alkaline Phosphatase: 62 U/L (ref 38–126)
Anion gap: 11 (ref 5–15)
BUN: 30 mg/dL — ABNORMAL HIGH (ref 8–23)
CO2: 30 mmol/L (ref 22–32)
Calcium: 9.3 mg/dL (ref 8.9–10.3)
Chloride: 100 mmol/L (ref 98–111)
Creatinine, Ser: 1.26 mg/dL — ABNORMAL HIGH (ref 0.61–1.24)
GFR calc Af Amer: >60 mL/min (ref >60)
GFR calc non Af Amer: 59 mL/min — ABNORMAL LOW (ref >60)
Glucose, Bld: 161 mg/dL — ABNORMAL HIGH (ref 70–99)
Potassium: 3.2 mmol/L — ABNORMAL LOW (ref 3.5–5.1)
Sodium: 141 mmol/L (ref 135–145)
Total Bilirubin: 0.9 mg/dL (ref 0.3–1.2)
Total Protein: 7.1 g/dL (ref 6.5–8.1)

## 2018-09-10 LAB — URINALYSIS, ROUTINE W REFLEX MICROSCOPIC
Bilirubin Urine: NEGATIVE
Glucose, UA: NEGATIVE mg/dL
Hgb urine dipstick: NEGATIVE
Ketones, ur: NEGATIVE mg/dL
Leukocytes,Ua: NEGATIVE
Nitrite: NEGATIVE
Protein, ur: NEGATIVE mg/dL
Specific Gravity, Urine: 1.008 (ref 1.005–1.030)
pH: 7 (ref 5.0–8.0)

## 2018-09-10 LAB — SURGICAL PCR SCREEN
MRSA, PCR: NEGATIVE
Staphylococcus aureus: NEGATIVE

## 2018-09-10 LAB — SARS CORONAVIRUS 2 (TAT 6-24 HRS): SARS Coronavirus 2: NEGATIVE

## 2018-09-10 NOTE — Pre-Procedure Instructions (Signed)
Christopher BridgeRickey L Salazar  09/10/2018     [id number] Octavio Manns- [place], [place] - [address] [facility] [address] [place] [id number] Phone: [phone] Fax: [phone]  [facility] [address] - [place], [place] - [address] [place] [id number] Phone: [phone] Fax: [phone]   Your procedure is scheduled on Monday June 22nd  [facility] at 1:00 P.M.  Call this number if you have problems the morning of surgery:  [phone]   Remember:  Do not eat after midnight on June 21st.  You may drink clear liquids until 12:00 P.M.  Clear liquids allowed are:    Water, Juice (non-citric and without pulp), Carbonated beverages, Clear Tea, Black Coffee only, Plain Jell-O only, Gatorade and Plain Popsicles only   Please complete your PRE-SURGERY ENSURE that was provided to you by 12:00 P.M.  Please, if able, drink it in one setting. DO NOT SIP.   Take these medicines the morning of surgery with A SIP OF WATER allopurinol (ZYLOPRIM), atorvastatin (LIPITOR) tamsulosin (FLOMAX)  As of today, STOP taking any Aspirin (unless otherwise instructed by your surgeon), other aspirin containing products, Aleve, Naproxen, Ibuprofen, Motrin, Advil, Goody's, BC's, all herbal medications, fish oil, and all vitamins.  Follow your surgeon's instructions on when to stop Aspirin.  If no instructions were given by your surgeon then you will need to call the office to get those instructions.      Special instructions:   [place]- Preparing For Surgery  Before surgery, you can play an important role. Because skin is not sterile, your skin needs to be as free of germs as possible. You can reduce the number of germs on your skin by washing with CHG (chlorahexidine gluconate) Soap before surgery.  CHG is an antiseptic cleaner which kills germs and bonds with the skin to continue killing germs even after washing.    Oral Hygiene is also important to  reduce your risk of infection.  Remember - BRUSH YOUR TEETH THE MORNING OF SURGERY WITH YOUR REGULAR TOOTHPASTE  Please do not use if you have an allergy to CHG or antibacterial soaps. If your skin becomes reddened/irritated stop using the CHG.  Do not shave (including legs and underarms) for at least 48 hours prior to first CHG shower. It is OK to shave your face.  Please follow these instructions carefully.   1. Shower the NIGHT BEFORE SURGERY and the MORNING OF SURGERY with CHG.   2. If you chose to wash your hair, wash your hair first as usual with your normal shampoo.  3. After you shampoo, rinse your hair and body thoroughly to remove the shampoo.  4. Use CHG as you would any other liquid soap. You can apply CHG directly to the skin and wash gently with a scrungie or a clean washcloth.   5. Apply the CHG Soap to your body ONLY FROM THE NECK DOWN.  Do not use on open wounds or open sores. Avoid contact with your eyes, ears, mouth and genitals (private parts). Wash Face and genitals (private parts)  with your normal soap.  6. Wash thoroughly, paying special attention to the area where your surgery will be performed.  7. Thoroughly rinse your body with warm water from the neck down.  8. DO NOT shower/wash with your normal soap after using and rinsing off the CHG Soap.  9. Pat yourself dry with a CLEAN TOWEL.  10. Wear CLEAN PAJAMAS  to bed the night before surgery, wear comfortable clothes the morning of surgery  11. Place CLEAN SHEETS on your bed the night of your first shower and DO NOT SLEEP WITH PETS.  Day of Surgery: Shower as stated above. Do not wear jewelry, make-up or nail polish.  Do not wear lotions, powders, or perfumes/colgne, or deodorant.  Do not shave 48 hours prior to surgery.    Do not bring valuables to the hospital.  [facility] is not responsible for any belongings or valuables.  Please wear clean clothes to the hospital/surgery center.   Remember to brush  your teeth WITH YOUR REGULAR TOOTHPASTE.  Contacts, dentures or bridgework may not be worn into surgery.  Leave your suitcase in the car.  After surgery it may be brought to your room.  For patients admitted to the hospital, discharge time will be determined by your treatment team.  Patients discharged the day of surgery will not be allowed to drive home.   Please read over the following fact sheets that you were given.

## 2018-09-10 NOTE — Progress Notes (Signed)
PCP - Dr. Elvina Mattes Cardiologist - Dr. Sharyon Cable  Chest x-ray - 09/10/2018 EKG - 09/10/2018 Stress Test - 2018 ECHO - 2018 Cardiac Cath - 2018  Sleep Study - n/a, positive stop bang, results sent to PCP CPAP -   Fasting Blood Sugar - n/a Checks Blood Sugar _____ times a day  Blood Thinner Instructions:n/a Aspirin Instructions: Last dose 09/08/2018  Anesthesia review: yes, history of cardiac testing, records requested  Patient denies shortness of breath, fever, cough and chest pain at PAT appointment   Patient verbalized understanding of instructions that were given to them at the PAT appointment. Patient was also instructed that they will need to review over the PAT instructions again at home before surgery.

## 2018-09-10 NOTE — Progress Notes (Addendum)
   09/10/18 1039  OBSTRUCTIVE SLEEP APNEA  Have you ever been diagnosed with sleep apnea through a sleep study? No  Do you snore loudly (loud enough to be heard through closed doors)?  0  Do you often feel tired, fatigued, or sleepy during the daytime (such as falling asleep during driving or talking to someone)? 0  Has anyone observed you stop breathing during your sleep? 0  Do you have, or are you being treated for high blood pressure? 1  BMI more than 35 kg/m2? 1  Age > 50 (1-yes) 1  Neck circumference greater than:Male 16 inches or larger, Male 17inches or larger? 1  Male Gender (Yes=1) 1  Obstructive Sleep Apnea Score 5  Score 5 or greater  Results sent to PCP

## 2018-09-11 MED ORDER — DEXTROSE 5 % IV SOLN
3.0000 g | INTRAVENOUS | Status: AC
  Administered 2018-09-14: 13:00:00 3 g via INTRAVENOUS
  Filled 2018-09-11: qty 3

## 2018-09-11 NOTE — Anesthesia Preprocedure Evaluation (Addendum)
Anesthesia Evaluation  Patient identified by MRN, date of birth, ID band Patient awake    Reviewed: Allergy & Precautions, NPO status , Patient's Chart, lab work & pertinent test results  Airway Mallampati: III  TM Distance: <3 FB Neck ROM: Full    Dental no notable dental hx. (+) Teeth Intact, Dental Advisory Given   Pulmonary neg pulmonary ROS, former smoker,    Pulmonary exam normal breath sounds clear to auscultation (-) decreased breath sounds      Cardiovascular hypertension, Pt. on medications + CAD  Normal cardiovascular exam Rhythm:Regular Rate:Normal     Neuro/Psych negative neurological ROS  negative psych ROS   GI/Hepatic Neg liver ROS, GERD  Medicated and Controlled,  Endo/Other  Morbid obesity  Renal/GU Renal InsufficiencyRenal disease  negative genitourinary   Musculoskeletal negative musculoskeletal ROS (+)   Abdominal (+) + obese,   Peds negative pediatric ROS (+)  Hematology negative hematology ROS (+)   Anesthesia Other Findings   Reproductive/Obstetrics negative OB ROS                          Anesthesia Physical Anesthesia Plan  ASA: III  Anesthesia Plan: General   Post-op Pain Management:    Induction: Intravenous  PONV Risk Score and Plan: 2 and Ondansetron, Dexamethasone and Treatment may vary due to age or medical condition  Airway Management Planned: Oral ETT and Video Laryngoscope Planned  Additional Equipment:   Intra-op Plan:   Post-operative Plan: Extubation in OR  Informed Consent: I have reviewed the patients History and Physical, chart, labs and discussed the procedure including the risks, benefits and alternatives for the proposed anesthesia with the patient or authorized representative who has indicated his/her understanding and acceptance.     Dental advisory given  Plan Discussed with: CRNA and Surgeon  Anesthesia Plan Comments:  (Cardiac workup 2018 in [place] for eval of fatigue. He had an abnormal nuclear stress that led to cardiac cath 07/09/16. Cath showed mild-mod CAD with normal LV function, normal aortic valve, normal mitral valve. Recommendation was to evaluate for noncardiac causes of symptoms and risk factor modification.  Cleared for surgery by PCP 09/08/18, note on pt chart.  [place] 07/09/16 (copy on pt chart): Conclusions: 1.  Mild to moderate coronary artery disease, 50% stenosis at the ostium of the first diagonal vessel that is too small for intervention and 30% stenosis in the midportion of the LAD. 2.  Normal LV systolic function.  Ejection fraction 60%. 3.  No significant aortic valve gradient. 4.  No mitral regurgitation. Recommendation: 1.  Consider search for noncardiac causes of symptoms. 2.  Continue aggressive risk factor modification.  Exercise treadmill sestamibi test 06/26/2016 (copy on patient chart): Findings: Abnormal stress findings.  Myocardial perfusion imaging in 3 views shows evidence of a small sized area of mild inferior ischemia on nuclear perfusion imaging.  Normal LV systolic function with an ejection fraction of 75% on gated analysis. Conclusions: 1.  Stress test is negative from an electrodiagnostic standpoint on rest and stress tracings. 2.  Nonsustained ventricular tachycardia at maximal exercise. 3.  No chest pain or tightness. 4.  Evidence of small sized area of mild inferior ischemia on nuclear perfusion imaging. 5.  Normal LV systolic function.  EF  75%. 6.  The quality of the scan was good.  Echo 06/18/2016 (copy on patient chart): Conclusions: 1.  Normal LV chamber size and normal LV systolic function.  LVEF 60%. 2.  Trivial  mitral regurgitation. 3.  Trivial tricuspid regurgitation. 4.  Mild left atrial enlargement. 5.  Mild concentric left ventricular enlargement.)      Anesthesia Quick Evaluation

## 2018-09-14 ENCOUNTER — Ambulatory Visit (HOSPITAL_COMMUNITY): Payer: Medicare Other

## 2018-09-14 ENCOUNTER — Encounter (HOSPITAL_COMMUNITY)
Admission: RE | Disposition: A | Discharge status: Home / Self Care | Payer: Self-pay | Source: Home / Self Care | Attending: Orthopaedic Surgery

## 2018-09-14 ENCOUNTER — Observation Stay (HOSPITAL_COMMUNITY)
Admission: RE | Admit: 2018-09-14 | Discharge: 2018-09-15 | Disposition: A | Discharge status: Home / Self Care | Payer: Medicare Other | Attending: Orthopaedic Surgery | Admitting: Orthopaedic Surgery

## 2018-09-14 ENCOUNTER — Ambulatory Visit (HOSPITAL_COMMUNITY): Payer: Medicare Other | Admitting: Physician Assistant

## 2018-09-14 ENCOUNTER — Encounter (HOSPITAL_COMMUNITY): Payer: Self-pay | Admitting: Anesthesiology

## 2018-09-14 ENCOUNTER — Ambulatory Visit (HOSPITAL_COMMUNITY): Payer: Medicare Other | Admitting: Anesthesiology

## 2018-09-14 ENCOUNTER — Other Ambulatory Visit: Payer: Self-pay

## 2018-09-14 DIAGNOSIS — M21371 Foot drop, right foot: Secondary | ICD-10-CM | POA: Insufficient documentation

## 2018-09-14 DIAGNOSIS — I2510 Atherosclerotic heart disease of native coronary artery without angina pectoris: Secondary | ICD-10-CM | POA: Insufficient documentation

## 2018-09-14 DIAGNOSIS — Z79899 Other long term (current) drug therapy: Secondary | ICD-10-CM | POA: Insufficient documentation

## 2018-09-14 DIAGNOSIS — R202 Paresthesia of skin: Secondary | ICD-10-CM | POA: Insufficient documentation

## 2018-09-14 DIAGNOSIS — M5126 Other intervertebral disc displacement, lumbar region: Secondary | ICD-10-CM

## 2018-09-14 DIAGNOSIS — N400 Enlarged prostate without lower urinary tract symptoms: Secondary | ICD-10-CM | POA: Insufficient documentation

## 2018-09-14 DIAGNOSIS — E7800 Pure hypercholesterolemia, unspecified: Secondary | ICD-10-CM | POA: Diagnosis not present

## 2018-09-14 DIAGNOSIS — K219 Gastro-esophageal reflux disease without esophagitis: Secondary | ICD-10-CM | POA: Diagnosis not present

## 2018-09-14 DIAGNOSIS — Z87891 Personal history of nicotine dependence: Secondary | ICD-10-CM | POA: Diagnosis not present

## 2018-09-14 DIAGNOSIS — I10 Essential (primary) hypertension: Secondary | ICD-10-CM | POA: Diagnosis not present

## 2018-09-14 DIAGNOSIS — M109 Gout, unspecified: Secondary | ICD-10-CM | POA: Diagnosis not present

## 2018-09-14 DIAGNOSIS — R262 Difficulty in walking, not elsewhere classified: Secondary | ICD-10-CM | POA: Diagnosis not present

## 2018-09-14 DIAGNOSIS — Z6835 Body mass index (BMI) 35.0-35.9, adult: Secondary | ICD-10-CM | POA: Insufficient documentation

## 2018-09-14 DIAGNOSIS — M5116 Intervertebral disc disorders with radiculopathy, lumbar region: Secondary | ICD-10-CM | POA: Diagnosis not present

## 2018-09-14 DIAGNOSIS — Z419 Encounter for procedure for purposes other than remedying health state, unspecified: Secondary | ICD-10-CM

## 2018-09-14 HISTORY — PX: LUMBAR LAMINECTOMY/DECOMPRESSION MICRODISCECTOMY: SHX5026

## 2018-09-14 SURGERY — LUMBAR LAMINECTOMY/DECOMPRESSION MICRODISCECTOMY
Anesthesia: General | Site: Back

## 2018-09-14 MED ORDER — BUPIVACAINE HCL (PF) 0.25 % IJ SOLN
INTRAMUSCULAR | Status: DC | PRN
  Administered 2018-09-14: 10 mL

## 2018-09-14 MED ORDER — METHOCARBAMOL 500 MG PO TABS
500.0000 mg | ORAL_TABLET | Freq: Four times a day (QID) | ORAL | Status: DC | PRN
  Administered 2018-09-14 – 2018-09-15 (×2): 500 mg via ORAL
  Filled 2018-09-14: qty 1

## 2018-09-14 MED ORDER — METHOCARBAMOL 500 MG PO TABS: 500.0000 mg | ORAL_TABLET | Freq: Four times a day (QID) | ORAL | 0 refills | Status: AC

## 2018-09-14 MED ORDER — ONDANSETRON HCL 4 MG/2ML IJ SOLN
INTRAMUSCULAR | Status: AC
  Filled 2018-09-14: qty 2

## 2018-09-14 MED ORDER — ROCURONIUM BROMIDE 10 MG/ML (PF) SYRINGE
PREFILLED_SYRINGE | INTRAVENOUS | Status: DC | PRN
  Administered 2018-09-14: 50 mg via INTRAVENOUS

## 2018-09-14 MED ORDER — ONDANSETRON HCL 4 MG/2ML IJ SOLN: 4.0000 mg | Freq: Four times a day (QID) | INTRAMUSCULAR | Status: DC | PRN

## 2018-09-14 MED ORDER — CEFAZOLIN SODIUM-DEXTROSE 1-4 GM/50ML-% IV SOLN
1.0000 g | Freq: Three times a day (TID) | INTRAVENOUS | Status: AC
  Administered 2018-09-14 (×2): 1 g via INTRAVENOUS
  Filled 2018-09-14 (×2): qty 50

## 2018-09-14 MED ORDER — FENTANYL CITRATE (PF) 100 MCG/2ML IJ SOLN
INTRAMUSCULAR | Status: DC | PRN
  Administered 2018-09-14: 50 ug via INTRAVENOUS
  Administered 2018-09-14: 100 ug via INTRAVENOUS

## 2018-09-14 MED ORDER — OXYCODONE HCL 5 MG PO TABS
5.0000 mg | ORAL_TABLET | ORAL | Status: DC | PRN
  Administered 2018-09-14 – 2018-09-15 (×4): 5 mg via ORAL
  Filled 2018-09-14 (×4): qty 1

## 2018-09-14 MED ORDER — BUPIVACAINE LIPOSOME 1.3 % IJ SUSP
20.0000 mL | INTRAMUSCULAR | Status: DC
  Filled 2018-09-14: qty 20

## 2018-09-14 MED ORDER — MENTHOL 3 MG MT LOZG
1.0000 | LOZENGE | OROMUCOSAL | Status: DC | PRN
  Administered 2018-09-14: 20:00:00 3 mg via ORAL
  Filled 2018-09-14: qty 9

## 2018-09-14 MED ORDER — HYDROMORPHONE HCL 1 MG/ML IJ SOLN
INTRAMUSCULAR | Status: AC
  Filled 2018-09-14: qty 1

## 2018-09-14 MED ORDER — LIDOCAINE 2% (20 MG/ML) 5 ML SYRINGE
INTRAMUSCULAR | Status: DC | PRN
  Administered 2018-09-14: 100 mg via INTRAVENOUS

## 2018-09-14 MED ORDER — ALLOPURINOL 100 MG PO TABS
100.0000 mg | ORAL_TABLET | Freq: Two times a day (BID) | ORAL | Status: DC
  Administered 2018-09-14: 20:00:00 100 mg via ORAL
  Filled 2018-09-14 (×2): qty 1

## 2018-09-14 MED ORDER — PANTOPRAZOLE SODIUM 40 MG PO TBEC
40.0000 mg | DELAYED_RELEASE_TABLET | Freq: Every day | ORAL | Status: DC
  Administered 2018-09-14: 16:00:00 40 mg via ORAL
  Filled 2018-09-14: qty 1

## 2018-09-14 MED ORDER — METHOCARBAMOL 500 MG PO TABS
ORAL_TABLET | ORAL | Status: AC
  Filled 2018-09-14: qty 1

## 2018-09-14 MED ORDER — DOCUSATE SODIUM 100 MG PO CAPS
100.0000 mg | ORAL_CAPSULE | Freq: Two times a day (BID) | ORAL | Status: DC
  Administered 2018-09-14 (×2): 100 mg via ORAL
  Filled 2018-09-14 (×2): qty 1

## 2018-09-14 MED ORDER — ONDANSETRON HCL 4 MG PO TABS: 4.0000 mg | ORAL_TABLET | Freq: Four times a day (QID) | ORAL | Status: DC | PRN

## 2018-09-14 MED ORDER — PROPOFOL 10 MG/ML IV BOLUS
INTRAVENOUS | Status: AC
  Filled 2018-09-14: qty 20

## 2018-09-14 MED ORDER — ONDANSETRON HCL 4 MG/2ML IJ SOLN
INTRAMUSCULAR | Status: DC | PRN
  Administered 2018-09-14: 4 mg via INTRAVENOUS

## 2018-09-14 MED ORDER — ACETAMINOPHEN 650 MG RE SUPP: 650.0000 mg | RECTAL | Status: DC | PRN

## 2018-09-14 MED ORDER — PHENYLEPHRINE 40 MCG/ML (10ML) SYRINGE FOR IV PUSH (FOR BLOOD PRESSURE SUPPORT)
PREFILLED_SYRINGE | INTRAVENOUS | Status: AC
  Filled 2018-09-14: qty 10

## 2018-09-14 MED ORDER — OXYCODONE-ACETAMINOPHEN 5-325 MG PO TABS: 1.0000 | ORAL_TABLET | Freq: Four times a day (QID) | ORAL | 0 refills | Status: AC | PRN

## 2018-09-14 MED ORDER — SODIUM CHLORIDE 0.9 % IV SOLN
INTRAVENOUS | Status: DC | PRN
  Administered 2018-09-14: 50 ug/min via INTRAVENOUS

## 2018-09-14 MED ORDER — ACETAMINOPHEN 325 MG PO TABS: 650.0000 mg | ORAL_TABLET | ORAL | Status: DC | PRN

## 2018-09-14 MED ORDER — HYDROMORPHONE HCL 1 MG/ML IJ SOLN
0.2500 mg | INTRAMUSCULAR | Status: DC | PRN
  Administered 2018-09-14: 14:00:00 0.25 mg via INTRAVENOUS

## 2018-09-14 MED ORDER — TAMSULOSIN HCL 0.4 MG PO CAPS
0.4000 mg | ORAL_CAPSULE | Freq: Every day | ORAL | Status: DC
  Administered 2018-09-14: 16:00:00 0.4 mg via ORAL
  Filled 2018-09-14: qty 1

## 2018-09-14 MED ORDER — DEXAMETHASONE SODIUM PHOSPHATE 10 MG/ML IJ SOLN
INTRAMUSCULAR | Status: AC
  Filled 2018-09-14: qty 1

## 2018-09-14 MED ORDER — OMEPRAZOLE MAGNESIUM 20 MG PO TBEC: 20.0000 mg | DELAYED_RELEASE_TABLET | Freq: Every day | ORAL | Status: DC

## 2018-09-14 MED ORDER — SUCCINYLCHOLINE CHLORIDE 200 MG/10ML IV SOSY
PREFILLED_SYRINGE | INTRAVENOUS | Status: AC
  Filled 2018-09-14: qty 10

## 2018-09-14 MED ORDER — PROPOFOL 10 MG/ML IV BOLUS
INTRAVENOUS | Status: DC | PRN
  Administered 2018-09-14: 200 mg via INTRAVENOUS

## 2018-09-14 MED ORDER — METHOCARBAMOL 1000 MG/10ML IJ SOLN
500.0000 mg | Freq: Four times a day (QID) | INTRAVENOUS | Status: DC | PRN
  Filled 2018-09-14: qty 5

## 2018-09-14 MED ORDER — SUCCINYLCHOLINE CHLORIDE 20 MG/ML IJ SOLN
INTRAMUSCULAR | Status: DC | PRN
  Administered 2018-09-14: 160 mg via INTRAVENOUS

## 2018-09-14 MED ORDER — IRBESARTAN 150 MG PO TABS
150.0000 mg | ORAL_TABLET | Freq: Every day | ORAL | Status: DC
  Filled 2018-09-14: qty 1

## 2018-09-14 MED ORDER — SODIUM CHLORIDE 0.9% FLUSH
3.0000 mL | Freq: Two times a day (BID) | INTRAVENOUS | Status: DC
  Administered 2018-09-14: 22:00:00 3 mL via INTRAVENOUS

## 2018-09-14 MED ORDER — TORSEMIDE 20 MG PO TABS
50.0000 mg | ORAL_TABLET | Freq: Two times a day (BID) | ORAL | Status: DC
  Administered 2018-09-14: 50 mg via ORAL
  Filled 2018-09-14 (×2): qty 1

## 2018-09-14 MED ORDER — SODIUM CHLORIDE 0.9% FLUSH: 3.0000 mL | INTRAVENOUS | Status: DC | PRN

## 2018-09-14 MED ORDER — AMLODIPINE BESYLATE 5 MG PO TABS: 5.0000 mg | ORAL_TABLET | Freq: Every day | ORAL | Status: DC

## 2018-09-14 MED ORDER — MIDAZOLAM HCL 5 MG/5ML IJ SOLN
INTRAMUSCULAR | Status: DC | PRN
  Administered 2018-09-14: 2 mg via INTRAVENOUS

## 2018-09-14 MED ORDER — LACTATED RINGERS IV SOLN
INTRAVENOUS | Status: DC
  Administered 2018-09-14 (×2): via INTRAVENOUS

## 2018-09-14 MED ORDER — LIDOCAINE 2% (20 MG/ML) 5 ML SYRINGE
INTRAMUSCULAR | Status: AC
  Filled 2018-09-14: qty 5

## 2018-09-14 MED ORDER — HYDROCHLOROTHIAZIDE 12.5 MG PO CAPS: 12.5000 mg | ORAL_CAPSULE | Freq: Every day | ORAL | Status: DC

## 2018-09-14 MED ORDER — SODIUM CHLORIDE 0.9 % IV SOLN: INTRAVENOUS | Status: DC

## 2018-09-14 MED ORDER — MIDAZOLAM HCL 2 MG/2ML IJ SOLN
INTRAMUSCULAR | Status: AC
  Filled 2018-09-14: qty 2

## 2018-09-14 MED ORDER — PROMETHAZINE HCL 25 MG/ML IJ SOLN: 6.2500 mg | INTRAMUSCULAR | Status: DC | PRN

## 2018-09-14 MED ORDER — BUPIVACAINE HCL (PF) 0.25 % IJ SOLN
INTRAMUSCULAR | Status: AC
  Filled 2018-09-14: qty 30

## 2018-09-14 MED ORDER — ATORVASTATIN CALCIUM 10 MG PO TABS: 10.0000 mg | ORAL_TABLET | Freq: Every day | ORAL | Status: DC

## 2018-09-14 MED ORDER — 0.9 % SODIUM CHLORIDE (POUR BTL) OPTIME
TOPICAL | Status: DC | PRN
  Administered 2018-09-14: 1000 mL

## 2018-09-14 MED ORDER — SODIUM CHLORIDE 0.9 % IV SOLN: 250.0000 mL | INTRAVENOUS | Status: DC

## 2018-09-14 MED ORDER — PHENOL 1.4 % MT LIQD: 1.0000 | OROMUCOSAL | Status: DC | PRN

## 2018-09-14 MED ORDER — ROCURONIUM BROMIDE 10 MG/ML (PF) SYRINGE
PREFILLED_SYRINGE | INTRAVENOUS | Status: AC
  Filled 2018-09-14: qty 10

## 2018-09-14 MED ORDER — HYDROMORPHONE HCL 1 MG/ML IJ SOLN: 0.5000 mg | INTRAMUSCULAR | Status: DC | PRN

## 2018-09-14 MED ORDER — DEXAMETHASONE SODIUM PHOSPHATE 10 MG/ML IJ SOLN
INTRAMUSCULAR | Status: DC | PRN
  Administered 2018-09-14: 10 mg via INTRAVENOUS

## 2018-09-14 MED ORDER — CHLORHEXIDINE GLUCONATE 4 % EX LIQD: 60.0000 mL | Freq: Once | CUTANEOUS | Status: DC

## 2018-09-14 MED ORDER — OLMESARTAN-AMLODIPINE-HCTZ 40-10-25 MG PO TABS: 0.5000 | ORAL_TABLET | Freq: Every day | ORAL | Status: DC

## 2018-09-14 MED ORDER — SUGAMMADEX SODIUM 200 MG/2ML IV SOLN
INTRAVENOUS | Status: DC | PRN
  Administered 2018-09-14: 250 mg via INTRAVENOUS
  Administered 2018-09-14: 150 mg via INTRAVENOUS

## 2018-09-14 MED ORDER — FENTANYL CITRATE (PF) 250 MCG/5ML IJ SOLN
INTRAMUSCULAR | Status: AC
  Filled 2018-09-14: qty 5

## 2018-09-14 SURGICAL SUPPLY — 48 items
BUR ROUND FLUTED 4 SOFT TCH (BURR) IMPLANT
BUR ROUND FLUTED 4MM SOFT TCH (BURR)
CANISTER SUCT 3000ML PPV (MISCELLANEOUS) ×3 IMPLANT
CLOSURE STERI-STRIP 1/2X4 (GAUZE/BANDAGES/DRESSINGS) ×1
CLSR STERI-STRIP ANTIMIC 1/2X4 (GAUZE/BANDAGES/DRESSINGS) ×2 IMPLANT
COVER SURGICAL LIGHT HANDLE (MISCELLANEOUS) ×3 IMPLANT
COVER WAND RF STERILE (DRAPES) ×3 IMPLANT
DECANTER SPIKE VIAL GLASS SM (MISCELLANEOUS) ×3 IMPLANT
DRAPE HALF SHEET 40X57 (DRAPES) ×6 IMPLANT
DRAPE MICROSCOPE LEICA (MISCELLANEOUS) ×3 IMPLANT
DRAPE SURG 17X23 STRL (DRAPES) ×3 IMPLANT
DRSG MEPILEX BORDER 4X4 (GAUZE/BANDAGES/DRESSINGS) ×3 IMPLANT
DURAPREP 26ML APPLICATOR (WOUND CARE) ×3 IMPLANT
ELECT BLADE 4.0 EZ CLEAN MEGAD (MISCELLANEOUS) ×3
ELECT REM PT RETURN 9FT ADLT (ELECTROSURGICAL) ×3
ELECTRODE BLDE 4.0 EZ CLN MEGD (MISCELLANEOUS) IMPLANT
ELECTRODE REM PT RTRN 9FT ADLT (ELECTROSURGICAL) ×1 IMPLANT
GAUZE SPONGE 4X4 12PLY STRL (GAUZE/BANDAGES/DRESSINGS) ×2 IMPLANT
GLOVE BIOGEL PI IND STRL 8 (GLOVE) ×2 IMPLANT
GLOVE BIOGEL PI INDICATOR 8 (GLOVE) ×4
GLOVE ORTHO TXT STRL SZ7.5 (GLOVE) ×6 IMPLANT
GOWN STRL REUS W/ TWL LRG LVL3 (GOWN DISPOSABLE) ×2 IMPLANT
GOWN STRL REUS W/ TWL XL LVL3 (GOWN DISPOSABLE) ×1 IMPLANT
GOWN STRL REUS W/TWL 2XL LVL3 (GOWN DISPOSABLE) ×3 IMPLANT
GOWN STRL REUS W/TWL LRG LVL3 (GOWN DISPOSABLE) ×4
GOWN STRL REUS W/TWL XL LVL3 (GOWN DISPOSABLE) ×2
KIT BASIN OR (CUSTOM PROCEDURE TRAY) ×3 IMPLANT
KIT TURNOVER KIT B (KITS) ×3 IMPLANT
MANIFOLD NEPTUNE II (INSTRUMENTS) ×3 IMPLANT
NDL HYPO 25GX1X1/2 BEV (NEEDLE) ×1 IMPLANT
NDL SPNL 18GX3.5 QUINCKE PK (NEEDLE) ×1 IMPLANT
NEEDLE HYPO 25GX1X1/2 BEV (NEEDLE) ×3 IMPLANT
NEEDLE SPNL 18GX3.5 QUINCKE PK (NEEDLE) ×3 IMPLANT
NS IRRIG 1000ML POUR BTL (IV SOLUTION) ×3 IMPLANT
PACK LAMINECTOMY ORTHO (CUSTOM PROCEDURE TRAY) ×3 IMPLANT
PAD ARMBOARD 7.5X6 YLW CONV (MISCELLANEOUS) ×6 IMPLANT
PATTIES SURGICAL .5 X.5 (GAUZE/BANDAGES/DRESSINGS) IMPLANT
PATTIES SURGICAL .75X.75 (GAUZE/BANDAGES/DRESSINGS) IMPLANT
SUT VIC AB 0 CT1 27 (SUTURE)
SUT VIC AB 0 CT1 27XBRD ANBCTR (SUTURE) IMPLANT
SUT VIC AB 1 CTX 36 (SUTURE) ×2
SUT VIC AB 1 CTX36XBRD ANBCTR (SUTURE) ×1 IMPLANT
SUT VIC AB 2-0 CT1 27 (SUTURE) ×2
SUT VIC AB 2-0 CT1 TAPERPNT 27 (SUTURE) ×1 IMPLANT
SUT VIC AB 3-0 X1 27 (SUTURE) ×3 IMPLANT
TAPE CLOTH SURG 6X10 WHT LF (GAUZE/BANDAGES/DRESSINGS) ×2 IMPLANT
TOWEL GREEN STERILE FF (TOWEL DISPOSABLE) ×3 IMPLANT
TOWEL OR 17X26 10 PK STRL BLUE (TOWEL DISPOSABLE) ×3 IMPLANT

## 2018-09-14 NOTE — Op Note (Signed)
Preop diagnosis: Recurrent L4-5 HNP with partial right foot drop.  Postop diagnosis: Same  Procedure: Right L4-L5 microdiscectomy lateral recess decompression for recurrent HNP  Surgeon: Rodell Perna, MD  Assistant: Benjiman Core, PA-C medically necessary and present for the entire procedure  Anesthesia General plus Marcaine local  Drains none  EBL less than 100 cc.  Procedure after standard prepping draping with patient in prone position after intubation preoperative Ancef prophylaxis arms at 9090 rolled yellow foam pads underneath the anterior shoulders and underneath the ulnar nerves calf pumpers for DVT prophylaxis based on chest rolls DuraPrep was used followed by square in the area with towel sterile skin marker was used for the old incision Betadine Steri-Drape and laminectomy sheet.  Timeout procedure was completed.  Spinal needle was placed at the palpable landmarks of L4-5 and crosstable lateral x-ray was taken with the needle directly over the disc space.  Incision was made subperiosteal dissection along the right side with placement of a deep Taylor retractor out over the facets was performed.  There was 2 mm interlaminar space between L4 and L5 and lamina of L4 was thinned with a 4 mm bur medial facetectomy was performed overhanging spurs were removed.  Extensive ligament were removed.  Disc was bulging and only a 2 mm Kerrison would fit between the bulging right lateral disc with partial calcification and the facet overhang that was pressing against the disc.  Spurs removed lateral recess was decompressed chunks of ligament were removed.  Nerve root was able to be retracted the midline and there was calcification of the annulus or disc protrusion.  Patient had previous surgery 35 years ago done in [place].  15 scalpel was used to excise some of the annulus and then 10 mm Kerrison was used to remove portions of the calcified disc.  Directly underneath that there were chunks of disc were  right paracentral causing protrusion this was decompressed using Epstein curettes up and down micropituitary straight pituitaries until decompression with the disc flap to the midline.  Nerve was followed out below the disc space along the medial pedicle exited easily.  Epidural space have been cauterized with the long bipolars.  Area was irrigated final passes were made anterior to the dura without there is compression except across on the opposite side where there still remains some disc bulge with calcification but patient was asymptomatic on the left side.  Operative field was dry operative microscope was removed.  #1 Vicryl.this placed in the fascia ~subtendinous tissue skin staple closure postop dressing and transferred to recovery room in stable condition.

## 2018-09-14 NOTE — Progress Notes (Signed)
Patient arrived to short stay c/o diarrhea.  Flagged on COVID screening.  Patient tested negative for COVID on 09/10/2018 and has been quarantined since testing.  Patient has no other COVID symptoms and is afebrile.  Dr. Marcie Bal and Dr. Lorin Mercy notified.  No new orders received. Will continue to monitor patient.

## 2018-09-14 NOTE — Transfer of Care (Signed)
Immediate Anesthesia Transfer of Care Note  Patient: Christopher Salazar  Procedure(s) Performed: RIGHT L4-5 MICRODISCECTOMY FOR RECURRENT HERNIATED NUCLEUS PULPOSUS (N/A Back)  Patient Location: PACU  Anesthesia Type:General  Level of Consciousness: awake, oriented and patient cooperative  Airway & Oxygen Therapy: Patient Spontanous Breathing and Patient connected to face mask oxygen  Post-op Assessment: Report given to RN and Post -op Vital signs reviewed and stable  Post vital signs: Reviewed  Last Vitals:  Vitals Value Taken Time  BP 137/68 09/14/18 1417  Temp    Pulse 83 09/14/18 1418  Resp 19 09/14/18 1418  SpO2 100 % 09/14/18 1418  Vitals shown include unvalidated device data.  Last Pain:  Vitals:   09/14/18 1123  TempSrc:   PainSc: 5       Patients Stated Pain Goal: 0 (34/91/79 1505)  Complications: No apparent anesthesia complications

## 2018-09-14 NOTE — Plan of Care (Signed)
Patient admitted and in stable condition. 

## 2018-09-14 NOTE — Anesthesia Postprocedure Evaluation (Signed)
Anesthesia Post Note  Patient: Christopher Salazar  Procedure(s) Performed: RIGHT L4-5 MICRODISCECTOMY FOR RECURRENT HERNIATED NUCLEUS PULPOSUS (N/A Back)     Patient location during evaluation: PACU Anesthesia Type: General Level of consciousness: awake and alert Pain management: pain level controlled Vital Signs Assessment: post-procedure vital signs reviewed and stable Respiratory status: spontaneous breathing, nonlabored ventilation, respiratory function stable and patient connected to nasal cannula oxygen Cardiovascular status: blood pressure returned to baseline and stable Postop Assessment: no apparent nausea or vomiting Anesthetic complications: no    Last Vitals:  Vitals:   09/14/18 1447 09/14/18 1509  BP: 124/76 (!) 144/80  Pulse: 80 87  Resp: 16 18  Temp: (!) 36.3 C 36.8 C  SpO2: 100% 97%    Last Pain:  Vitals:   09/14/18 1609  TempSrc:   PainSc: 5                  Lieutenant Abarca S

## 2018-09-14 NOTE — Interval H&P Note (Signed)
History and Physical Interval Note:  09/14/2018 12:13 PM  Christopher Salazar  has presented today for surgery, with the diagnosis of right L4-5 recurrent herniated nucleus pulposus.  The various methods of treatment have been discussed with the patient and family. After consideration of risks, benefits and other options for treatment, the patient has consented to  Procedure(s): RIGHT L4-5 MICRODISCECTOMY FOR RECURRENT HERNIATED NUCLEUS PULPOSUS (N/A) as a surgical intervention.  The patient's history has been reviewed, patient examined, no change in status, stable for surgery.  I have reviewed the patient's chart and labs.  Questions were answered to the patient's satisfaction.     Marybelle Killings

## 2018-09-14 NOTE — H&P (Signed)
Patient: Christopher Salazar                                       Date of Birth: [birth year]                                                  MRN: [id number] Visit Date: 08/27/2018                                                                     Requested by: Jacqualine Code, [place] [address] PCP: Jacqualine Code, DO   Assessment & Plan: Visit Diagnoses:  1. Lumbar disc herniation with radiculopathy   2. Chronic right-sided low back pain with right-sided sciatica   3. Acquired right foot drop     Plan: Patient has recurrent disc protrusion right paracentral at L4-5 with partial foot drop and increased weakness.  He is failed prednisone Dosepak pain medication has been using oxycodone for pain, baclofen, gabapentin.  He has had previous epidural and has persistent radicular symptoms.  Plan will be microdiscectomy for recurrent HNP overnight stay in the hospital.  We discussed risks of surgery including risk of recurrent disc herniation quoted at 5 %, possibility of Intra-Op dural tear, potential for progression of degeneration and need for fusion.  Questions were elicited and answered he understands and requests we proceed.  Patient's wife was present today and included in the discussion.  Follow-Up Instructions: No follow-ups on file.   Orders:  No orders of the defined types were placed in this encounter.  No orders of the defined types were placed in this encounter.     Procedures: No procedures performed   Clinical Data: No additional findings.   Subjective:    Chief Complaint  Patient presents with  . Lower Back - Pain, Follow-up    HPI 68 year old male seen at the request of Dr. Layne Benton for L4-5 HNP which is a recurrent disc herniation with radiculopathy.  Patient's had symptoms since the beginning of April with back pain right leg pain and partial foot drop.  Onset of symptoms was when he was lifting heavy rocks in  the yard and suddenly felt sharp excruciating back pain right leg pain with right leg weakness.  He was treated with prednisone, OxyContin, gabapentin.  Previous L4-5 disc surgery done in [facility] by Dr. Glennon Mac 35 years ago.  MRI scan when I saw him in 2019 showed central disc extrusion with right paracentral and caudal migration with lateral recess compression.  Patient had an epidural injection by Dr. Ernestina Patches on 07/29/2018 with some improvement in his back pain but he still has right leg weakness difficulty walking is been catching his toe and has burning and stinging in his leg and foot on the right side only.  Patient states something needs to be done and he wants to proceed with operative treatment at this time.  Review of Systems patient's had past history of hypertension.  Previous left carpal tunnel release by  me August 2019 doing well.  History of kidney cyst without malignancy.  He had ear cyst excision 2013.  Patient is a former smoker.  Does not drink no drug use.   Objective: Vital Signs: Ht 5\' 11"  (1.803 m)   Wt 258 lb (117 kg)   BMI 35.98 kg/m   Physical Exam Constitutional:      Appearance: He is well-developed.  HENT:     Head: Normocephalic and atraumatic.  Eyes:     Pupils: Pupils are equal, round, and reactive to light.  Neck:     Thyroid: No thyromegaly.     Trachea: No tracheal deviation.  Cardiovascular:     Rate and Rhythm: Normal rate.  Pulmonary:     Effort: Pulmonary effort is normal.     Breath sounds: No wheezing.  Abdominal:     General: Bowel sounds are normal.     Palpations: Abdomen is soft.  Skin:    General: Skin is warm and dry.     Capillary Refill: Capillary refill takes less than 2 seconds.  Neurological:     Mental Status: He is alert and oriented to person, place, and time.  Psychiatric:        Behavior: Behavior normal.        Thought Content: Thought content normal.        Judgment: Judgment normal.     Ortho Exam  patient has well-healed lumbar incision at L4-5.  Sciatic notch tenderness on the right positive straight leg raising at 60 degrees.  Anterior tib is weak able to be overcome with 2 finger pressure.  EHL on the right is 3+ out of 5 with significant weakness.  Opposite left ankle shows normal dorsiflexion plantarflexion strength.  Patient cannot heel walk on the right he is able to toe walk.  Specialty Comments:  No specialty comments available.  Imaging: CLINICAL DATA: Low back pain radiating to the right foot and lower leg for 5 weeks since an injury doing yard work. Initial encounter.  EXAM: MRI LUMBAR SPINE WITHOUT CONTRAST  TECHNIQUE: Multiplanar, multisequence MR imaging of the lumbar spine was performed. No intravenous contrast was administered.  COMPARISON: Lumbar spine 07/01/2017.  FINDINGS: Segmentation: Standard.  Alignment: Maintained.  Vertebrae: No fracture or worrisome lesion.  Conus medullaris and cauda equina: Conus extends to the L1 level. Conus and cauda equina appear normal.  Paraspinal and other soft tissues: T2 hyperintense lesions in the kidneys are partially visualized but appear unchanged. Otherwise negative.  Disc levels:  T10-11 and T11-12 are imaged in the sagittal plane only and negative.  T12-L1: Negative.  L1-2: Negative.  L2-3: Mild facet degenerative change. Otherwise negative.  L3-4: Mild facet degenerative change. Otherwise negative.  L4-5: Left laminectomy defect as seen on the prior exam. Broad-based right paracentral protrusion with some caudal extension is again seen. Mild narrowing is seen in the subarticular recesses. There is left worse than right foraminal narrowing due to disc and facet degenerative change. The appearance of this level is stable.  L5-S1: Prominent epidural. Mild disc bulge without stenosis.  IMPRESSION: No change in the appearance of the lumbar spine. No new abnormality since  prior exam.  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 Broad-based central protrusion causes mild narrowing in the subarticular recesses and left worse than right foraminal narrowing.   Electronically Signed By: Drusilla Kannerhomas Dalessio M.D. On: 08/22/2018 11:04    PMFS History:     Patient Active Problem List  Diagnosis Date Noted  . Lumbar disc herniation with radiculopathy 07/23/2018  . S/P carpal tunnel release 11/20/2017  . Chronic right-sided low back pain with right-sided sciatica 07/04/2017       Past Medical History:  Diagnosis Date  . Hypertension   . Kidney cysts     No family history on file.       Past Surgical History:  Procedure Laterality Date  . BACK SURGERY    . CARPAL TUNNEL RELEASE Left 11/10/2017  . EAR CYST EXCISION  11/25/2011   Procedure: CYST REMOVAL;  Surgeon: Melvenia BeamMitchell Gore, MD;  [facility];  Service: ENT;  Laterality: N/A;  . THROAT SURGERY     Social History       Occupational History  . Not on file  Tobacco Use  . Smoking status: Former Games developermoker  . Smokeless tobacco: Never Used  Substance and Sexual Activity  . Alcohol use: No  . Drug use: No  . Sexual activity: Not on file

## 2018-09-14 NOTE — Anesthesia Procedure Notes (Addendum)
Procedure Name: Intubation Date/Time: 09/14/2018 12:48 PM Performed by: Jenne Campus, CRNA Pre-anesthesia Checklist: Patient identified, Emergency Drugs available, Suction available and Patient being monitored Patient Re-evaluated:Patient Re-evaluated prior to induction Oxygen Delivery Method: Circle System Utilized Preoxygenation: Pre-oxygenation with 100% oxygen Induction Type: IV induction Laryngoscope Size: Glidescope and 4 Grade View: Grade I Tube type: Oral Tube size: 7.5 mm Number of attempts: 1 Airway Equipment and Method: Stylet and Oral airway Placement Confirmation: ETT inserted through vocal cords under direct vision,  positive ETCO2 and breath sounds checked- equal and bilateral Secured at: 22 cm Tube secured with: Tape Dental Injury: Teeth and Oropharynx as per pre-operative assessment  Difficulty Due To: Difficulty was anticipated and Difficult Airway- due to anterior larynx

## 2018-09-15 ENCOUNTER — Encounter (HOSPITAL_COMMUNITY): Payer: Self-pay | Admitting: Orthopaedic Surgery

## 2018-09-15 DIAGNOSIS — M5116 Intervertebral disc disorders with radiculopathy, lumbar region: Secondary | ICD-10-CM | POA: Diagnosis not present

## 2018-09-15 NOTE — Evaluation (Signed)
Occupational Therapy Evaluation and Discharge Patient Details Name: Christopher Salazar MRN: [id number] DOB: 1950-06-03 Today's Date: 09/15/2018    History of Present Illness This 68 yo male s/p right L4-L5 microdiscectomy lateral recess decompression for recurrent HNP   Clinical Impression   This 68 yo male admitted and underwent above presents to acute OT with all education completed, we will D/C from acute OT.    Follow Up Recommendations  No OT follow up;Supervision - Intermittent    Equipment Recommendations  None recommended by OT       Precautions / Restrictions Precautions Precautions: Back Precaution Booklet Issued: Yes (comment) Restrictions Weight Bearing Restrictions: No      Mobility Bed Mobility               General bed mobility comments: Pt up in recliner upon my arrival  Transfers Overall transfer level: Needs assistance Equipment used: None Transfers: Sit to/from Stand Sit to Stand: Modified independent (Device/Increase time)(increased time)         General transfer comment: Pt ambulated entire unit and hallway of 3C without AD, but did intermittently hold onto handrail. The more he walked the more his limped increased--told him to speak to Dr. Lorin Mercy at follow up to see if he would want him to start OP PT at some point for his weakness. Educated patient up and down step with proper leg    Balance Overall balance assessment: Mild deficits observed, not formally tested                                         ADL either performed or assessed with clinical judgement   ADL                                         General ADL Comments: Pt educated on LBD without bending (he can do all except socks--reports his wife will A), educated on not sitting for more than 20-30 minutes at a time, using vanity beside his toilet at home to A with up and down from commode, use of wet wipes for back peri care, sequence for in and  out of bed, using two cups for brushing teeth.     Vision Patient Visual Report: No change from baseline              Pertinent Vitals/Pain Pain Assessment: 0-10 Pain Score: 4  Pain Location: back of left calf Pain Descriptors / Indicators: Aching;Sore Pain Intervention(s): Limited activity within patient's tolerance;Monitored during session;Other (comment)(educated on stretching of left calf)     Hand Dominance Right   Extremity/Trunk Assessment Upper Extremity Assessment Upper Extremity Assessment: Overall WFL for tasks assessed           Communication Communication Communication: No difficulties   Cognition Arousal/Alertness: Awake/alert Behavior During Therapy: WFL for tasks assessed/performed Overall Cognitive Status: Within Functional Limits for tasks assessed                                        Exercises Other Exercises Other Exercises: Educated pt on stretching for right calf (in sitting, supine, and standing). Pt reports his left calf hurt yesterday but is better and now it is his right  one        Home Living Family/patient expects to be discharged to:: Private residence Living Arrangements: Spouse/significant other Available Help at Discharge: Available 24 hours/day Type of Home: House Home Access: Stairs to enter Entergy CorporationEntrance Stairs-Number of Steps: 1 Entrance Stairs-Rails: None Home Layout: One level     Bathroom Shower/Tub: Producer, television/film/videoWalk-in shower   Bathroom Toilet: Handicapped height     Home Equipment: Environmental consultantWalker - 2 wheels          Prior Functioning/Environment Level of Independence: Independent with assistive device(s)        Comments: Using a RW as of late        OT Problem List: Decreased range of motion;Pain      OT Treatment/Interventions:      OT Goals(Current goals can be found in the care plan section) Acute Rehab OT Goals Patient Stated Goal: home today  OT Frequency:                AM-PAC OT "6 Clicks"  Daily Activity     Outcome Measure Help from another person eating meals?: None Help from another person taking care of personal grooming?: None Help from another person toileting, which includes using toliet, bedpan, or urinal?: None Help from another person bathing (including washing, rinsing, drying)?: A Little Help from another person to put on and taking off regular upper body clothing?: None Help from another person to put on and taking off regular lower body clothing?: A Little 6 Click Score: 22   End of Session Nurse Communication: (pt ready to go from therapy standpoint)  Activity Tolerance: Patient tolerated treatment well Patient left: in chair;with call bell/phone within reach  OT Visit Diagnosis: Other abnormalities of gait and mobility (R26.89);Pain Pain - Right/Left: Right Pain - part of body: (calf)                Time: [id number] OT Time Calculation (min): 36 min Charges:  OT General Charges $OT Visit: 1 Visit OT Evaluation $OT Eval Moderate Complexity: 1 Mod OT Treatments $Self Care/Home Management : 8-22 mins  Ignacia Palmaathy Jennise Both, OTR/L [facility] Pager [phone] Office [phone]     Evette GeorgesLeonard, Elayah Klooster Eva 09/15/2018, 11:34 AM

## 2018-09-15 NOTE — Progress Notes (Addendum)
Subjective: 1 Day Post-Op Procedure(s) (LRB): RIGHT L4-5 MICRODISCECTOMY FOR RECURRENT HERNIATED NUCLEUS PULPOSUS (N/A) Patient reports pain as mild and moderate.  No leg pain. Some leg tingling. Weakness is gone.   Objective: Vital signs in last 24 hours: Temp:  [97.3 F (36.3 C)-98.3 F (36.8 C)] 97.8 F (36.6 C) (06/23 0742) Pulse Rate:  [71-94] 72 (06/23 0742) Resp:  [16-20] 20 (06/23 0742) BP: (111-144)/(68-80) 111/68 (06/23 0742) SpO2:  [93 %-100 %] 93 % (06/23 0742) Weight:  [099 kg] 117 kg (06/22 1047)  Intake/Output from previous day: 06/22 0701 - 06/23 0700 In: 1296.2 [I.V.:1215; IV Piggyback:81.2] Out: 25 [Blood:25] Intake/Output this shift: No intake/output data recorded.  No results for input(s): HGB in the last 72 hours. No results for input(s): WBC, RBC, HCT, PLT in the last 72 hours. No results for input(s): NA, K, CL, CO2, BUN, CREATININE, GLUCOSE, CALCIUM in the last 72 hours. No results for input(s): LABPT, INR in the last 72 hours.  Neurologically intact   Assessment/Plan: 1 Day Post-Op Procedure(s) (LRB): RIGHT L4-5 MICRODISCECTOMY FOR RECURRENT HERNIATED NUCLEUS PULPOSUS (N/A) Plan:  Discharge home. Office [place] 9 days. dressing change.       Marybelle Killings 09/15/2018, 7:45 AM

## 2018-09-15 NOTE — Plan of Care (Signed)
Patient alert and oriented, mae's well, voiding adequate amount of urine, swallowing without difficulty, no c/o pain at time of discharge. Patient discharged home with family. Script and discharged instructions given to patient. Patient and family stated understanding of instructions given. Patient has an appointment with Dr. Yates  

## 2018-09-17 NOTE — Discharge Summary (Signed)
Patient ID: Christopher Salazar MRN: [id number] DOB/AGE: 1950-12-30 68 y.o.  Admit date: 09/14/2018 Discharge date: 09/17/2018  Admission Diagnoses:  Active Problems:   HNP (herniated nucleus pulposus), lumbar   Herniated lumbar intervertebral disc   Discharge Diagnoses:  Active Problems:   HNP (herniated nucleus pulposus), lumbar   Herniated lumbar intervertebral disc  status post Procedure(s): RIGHT L4-5 MICRODISCECTOMY FOR RECURRENT HERNIATED NUCLEUS PULPOSUS  Past Medical History:  Diagnosis Date  . BPH (benign prostatic hyperplasia)   . GERD (gastroesophageal reflux disease)   . Gout   . High cholesterol   . Hypertension   . Kidney cysts   . Pneumonia    "20-30 years ago"    Surgeries: Procedure(s): RIGHT L4-5 MICRODISCECTOMY FOR RECURRENT HERNIATED NUCLEUS PULPOSUS on 09/14/2018   Consultants:   Discharged Condition: Improved  Hospital Course: Christopher Salazar is an 68 y.o. male who was admitted 09/14/2018 for operative treatment of lumbar HNP. Patient failed conservative treatments (please see the history and physical for the specifics) and had severe unremitting pain that affects sleep, daily activities and work/hobbies. After pre-op clearance, the patient was taken to the operating room on 09/14/2018 and underwent  Procedure(s): RIGHT L4-5 MICRODISCECTOMY FOR RECURRENT HERNIATED NUCLEUS PULPOSUS.    Patient was given perioperative antibiotics:  Anti-infectives (From admission, onward)   Start     Dose/Rate Route Frequency Ordered Stop   09/14/18 1500  ceFAZolin (ANCEF) IVPB 1 g/50 mL premix     1 g 100 mL/hr over 30 Minutes Intravenous Every 8 hours 09/14/18 1458 09/14/18 2201   09/14/18 1145  ceFAZolin (ANCEF) 3 g in dextrose 5 % 50 mL IVPB     3 g 100 mL/hr over 30 Minutes Intravenous To [facility] 09/11/18 1042 09/14/18 1529       Patient was given sequential compression devices and early ambulation to prevent DVT.   Patient benefited  maximally from hospital stay and there were no complications. At the time of discharge, the patient was urinating/moving their bowels without difficulty, tolerating a regular diet, pain is controlled with oral pain medications and they have been cleared by PT/OT.   Recent vital signs: No data found.   Recent laboratory studies: No results for input(s): WBC, HGB, HCT, PLT, NA, K, CL, CO2, BUN, CREATININE, GLUCOSE, INR, CALCIUM in the last 72 hours.  Invalid input(s): PT, 2   Discharge Medications:   Allergies as of 09/15/2018      Reactions   Lactose Intolerance (gi) Other (See Comments)   Milk/Dairy > Sneezing      Medication List    TAKE these medications   allopurinol 100 MG tablet Commonly known as: ZYLOPRIM Take 100 mg by mouth 2 (two) times a day.   aspirin EC 81 MG tablet Take 81 mg by mouth daily.   atorvastatin 10 MG tablet Commonly known as: LIPITOR Take 10 mg by mouth daily.   methocarbamol 500 MG tablet Commonly known as: Robaxin Take 1 tablet (500 mg total) by mouth 4 (four) times daily.   omeprazole 20 MG tablet Commonly known as: PRILOSEC OTC Take 20 mg by mouth daily.   oxyCODONE-acetaminophen 5-325 MG tablet Commonly known as: PERCOCET/ROXICET Take 1 tablet by mouth every 6 (six) hours as needed for severe pain. What changed: how much to take   tamsulosin 0.4 MG Caps capsule Commonly known as: FLOMAX Take 0.4 mg by mouth daily.   torsemide 100 MG tablet Commonly known as: DEMADEX Take 50 mg by mouth 2 (two)  times a day.   Tribenzor 40-10-25 MG Tabs Generic drug: Olmesartan-amLODIPine-HCTZ Take 0.5 tablets by mouth daily.       Diagnostic Studies: Dg Chest 2 View  Result Date: 09/10/2018 CLINICAL DATA:  Pre admit for lumbar fusion,,htn,,no chest comp,former smoker EXAM: CHEST - 2 VIEW COMPARISON:  Chest x-ray dated 12/15/2009. FINDINGS: Heart size and mediastinal contours are within normal limits. Lungs are clear. No pleural effusion or  pneumothorax seen. Osseous structures about the chest are unremarkable. IMPRESSION: No active cardiopulmonary disease. Electronically Signed   By: Bary RichardStan  Maynard M.D.   On: 09/10/2018 16:38   Dg Lumbar Spine 2-3 Views  Result Date: 09/14/2018 CLINICAL DATA:  L4-5 micro discectomy. EXAM: LUMBAR SPINE - 2-3 VIEW COMPARISON:  MRI of Aug 22, 2018. FINDINGS: First intraoperative cross-table lateral projection of the lumbar spine demonstrates a surgical probe directed toward the posterior margin of L4-5 level. Second intraoperative cross-table lateral projection demonstrates another surgical probe and retractor posterior to L4-5. IMPRESSION: Surgical localization as described above. Electronically Signed   By: Lupita RaiderJames  Green Jr M.D.   On: 09/14/2018 14:13   Mr Lumbar Spine Wo Contrast  Result Date: 08/22/2018 CLINICAL DATA:  Low back pain radiating to the right foot and lower leg for 5 weeks since an injury doing yard work. Initial encounter. EXAM: MRI LUMBAR SPINE WITHOUT CONTRAST TECHNIQUE: Multiplanar, multisequence MR imaging of the lumbar spine was performed. No intravenous contrast was administered. COMPARISON:  Lumbar spine 07/01/2017. FINDINGS: Segmentation:  Standard. Alignment:  Maintained. Vertebrae:  No fracture or worrisome lesion. Conus medullaris and cauda equina: Conus extends to the L1 level. Conus and cauda equina appear normal. Paraspinal and other soft tissues: T2 hyperintense lesions in the kidneys are partially visualized but appear unchanged. Otherwise negative. Disc levels: T10-11 and T11-12 are imaged in the sagittal plane only and negative. T12-L1: Negative. L1-2: Negative. L2-3: Mild facet degenerative change.  Otherwise negative. L3-4: Mild facet degenerative change.  Otherwise negative. L4-5: Left laminectomy defect as seen on the prior exam. Broad-based right paracentral protrusion with some caudal extension is again seen. Mild narrowing is seen in the subarticular recesses. There is  left worse than right foraminal narrowing due to disc and facet degenerative change. The appearance of this level is stable. L5-S1: Prominent epidural.  Mild disc bulge without stenosis. IMPRESSION: No change in the appearance of the lumbar spine. No new abnormality since prior exam. 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 Broad-based central protrusion causes mild narrowing in the subarticular recesses and left worse than right foraminal narrowing. Electronically Signed   By: Drusilla Kannerhomas  Dalessio M.D.   On: 08/22/2018 11:04    Discharge Instructions    Incentive spirometry RT   Complete by: As directed       Follow-up Information    Eldred MangesYates, Mark C, MD Follow up in 9 day(s).   Specialty: Orthopedic Surgery Why: see Dr. Ophelia CharterYates in [facility] in 9 days on Thursday Contact information: [address] [place] [address] [phone]           Discharge Plan:  discharge to home  Disposition:     Signed: Zonia KiefJames Marcea Rojek for Annell GreeningMark Yates MD 09/17/2018, 11:05 AM

## 2018-09-24 ENCOUNTER — Ambulatory Visit (INDEPENDENT_AMBULATORY_CARE_PROVIDER_SITE_OTHER): Payer: Medicare Other | Admitting: Orthopaedic Surgery

## 2018-09-24 ENCOUNTER — Encounter: Payer: Self-pay | Admitting: Orthopaedic Surgery

## 2018-09-24 ENCOUNTER — Other Ambulatory Visit: Payer: Self-pay

## 2018-09-24 VITALS — BP 115/68 | HR 76 | Ht 71.0 in | Wt 258.0 lb

## 2018-09-24 DIAGNOSIS — M5126 Other intervertebral disc displacement, lumbar region: Secondary | ICD-10-CM

## 2018-09-24 NOTE — Progress Notes (Signed)
   Post-Op Visit Note   Patient: Christopher Salazar           Date of Birth: 10/21/1950           MRN: [id number] Visit Date: 09/24/2018 PCP: Jacqualine Code, DO   Assessment & Plan: Postop lumbar microdiscectomy.  Good relief of preop back and leg pain.  Staples removed Steri-Strips applied he still has numbness in his foot.  Just taking pain medicine at night to help him sleep continue walking weight loss return PRN.  Chief Complaint:  Chief Complaint  Patient presents with  . Lower Back - Routine Post Op    09/14/2018 Right L4-5 Microdiscectomy   Visit Diagnoses:  1. HNP (herniated nucleus pulposus), lumbar     Plan: Gradual increase walking.  He will work on weight loss to unload his back.  He is happy with the surgical result return PRN.  Follow-Up Instructions: Return if symptoms worsen or fail to improve.   Orders:  No orders of the defined types were placed in this encounter.  No orders of the defined types were placed in this encounter.   Imaging: No results found.  PMFS History: Patient Active Problem List   Diagnosis Date Noted  . HNP (herniated nucleus pulposus), lumbar 09/14/2018  . Herniated lumbar intervertebral disc 09/14/2018  . Lumbar disc herniation with radiculopathy 07/23/2018  . S/P carpal tunnel release 11/20/2017  . Chronic right-sided low back pain with right-sided sciatica 07/04/2017   Past Medical History:  Diagnosis Date  . BPH (benign prostatic hyperplasia)   . GERD (gastroesophageal reflux disease)   . Gout   . High cholesterol   . Hypertension   . Kidney cysts   . Pneumonia    "20-30 years ago"    No family history on file.  Past Surgical History:  Procedure Laterality Date  . BACK SURGERY     35 years ago  . CARDIAC CATHETERIZATION  2018  . CARPAL TUNNEL RELEASE Left 11/10/2017  . CARPAL TUNNEL RELEASE Right   . EAR CYST EXCISION  11/25/2011   Procedure: CYST REMOVAL;  Surgeon: Ruby Cola, MD;  Location: [facility];   Service: ENT;  Laterality: N/A;  . LUMBAR LAMINECTOMY/DECOMPRESSION MICRODISCECTOMY N/A 09/14/2018   Procedure: RIGHT L4-5 MICRODISCECTOMY FOR RECURRENT HERNIATED NUCLEUS PULPOSUS;  Surgeon: Marybelle Killings, MD;  Location: [place];  Service: Orthopedics;  Laterality: N/A;  . THROAT SURGERY     Social History   Occupational History  . Not on file  Tobacco Use  . Smoking status: Former Research scientist (life sciences)  . Smokeless tobacco: Never Used  Substance and Sexual Activity  . Alcohol use: No  . Drug use: No  . Sexual activity: Not on file

## 2019-06-18 IMAGING — MR MRI LUMBAR SPINE WITHOUT CONTRAST
4 of 5 series · 25 of 48 positions shown · non-contrast
Comparison: Lumbar spine 07/01/2017.

CLINICAL DATA: Low back pain radiating to the right foot and lower
leg for 5 weeks since an injury doing yard work. Initial encounter.

EXAM:
MRI LUMBAR SPINE WITHOUT CONTRAST
TECHNIQUE: Multiplanar, multisequence MR imaging of the lumbar spine was
performed. No intravenous contrast was administered.

[Series 2: T2 · sagittal · 4.0mm · 0.55mm/px · 6 of 15 slices shown (1 of 2)]
[im 1/15]
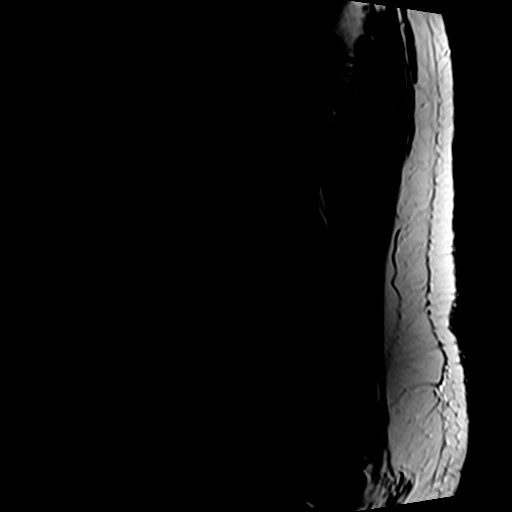
[im 3/15]
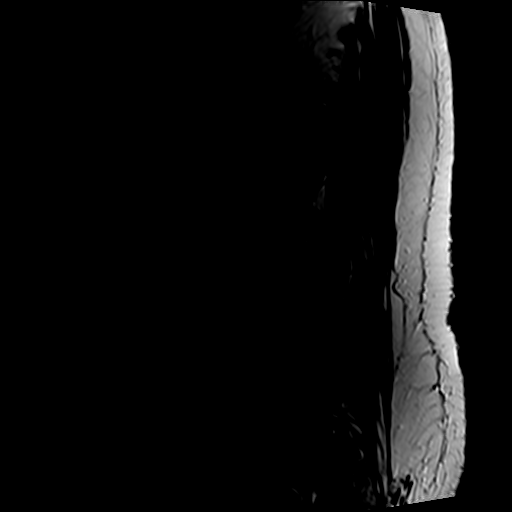
[im 6/15]
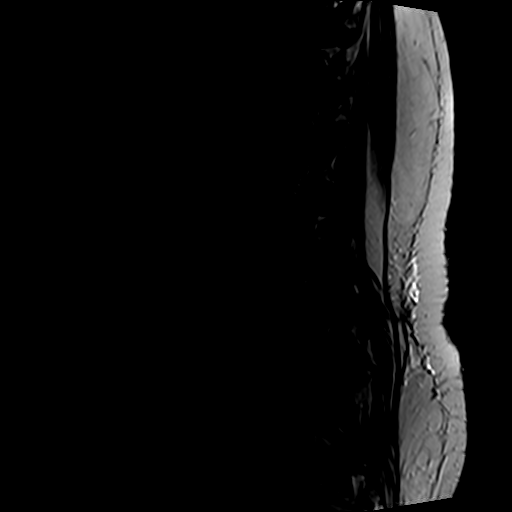
[im 9/15]
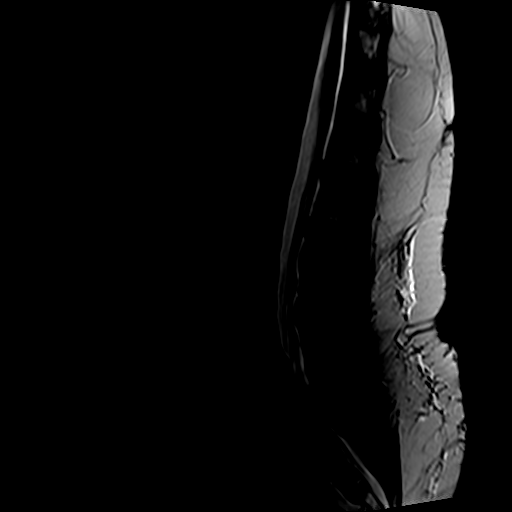
[im 12/15]
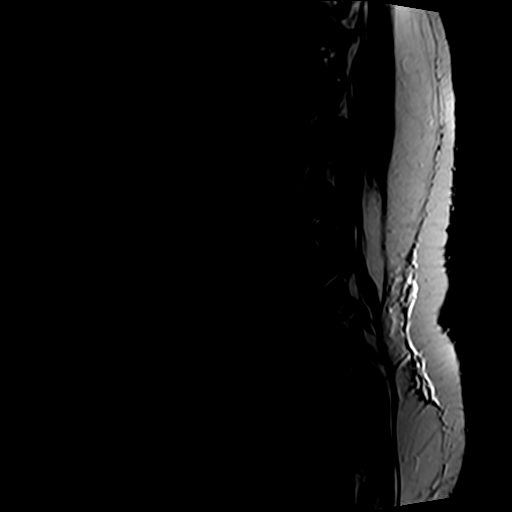
[im 15/15]
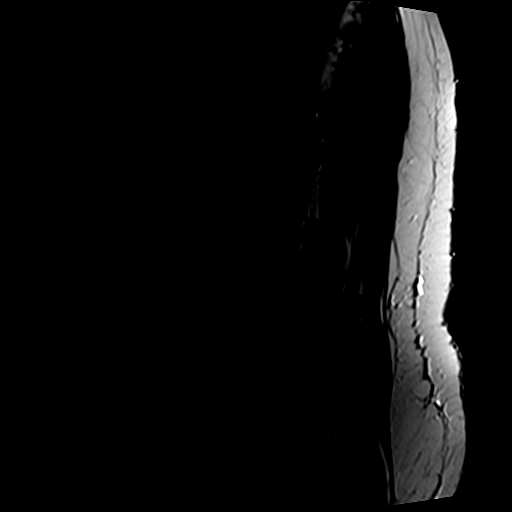

[Series 4: T1 · sagittal · 4.0mm · 0.55mm/px · 6 of 15 slices shown (1 of 2)]
[im 1/15]
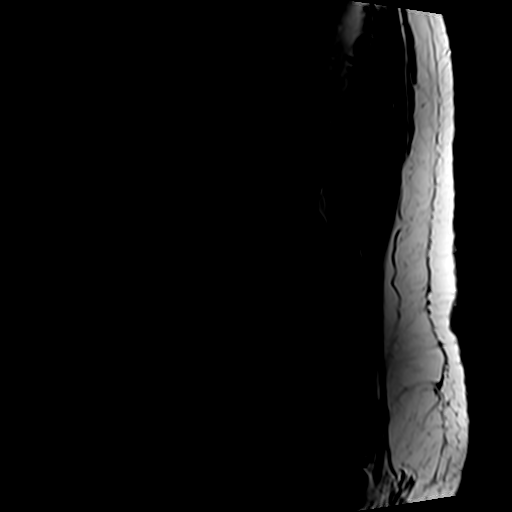
[im 3/15]
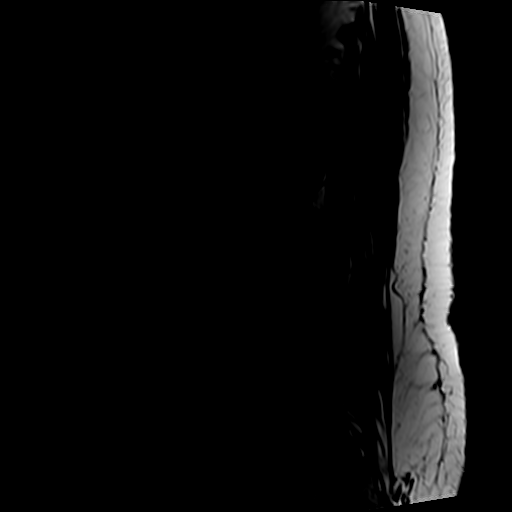
[im 6/15]
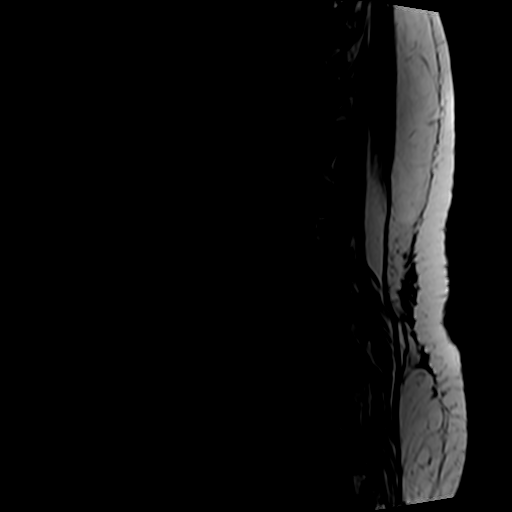
[im 9/15]
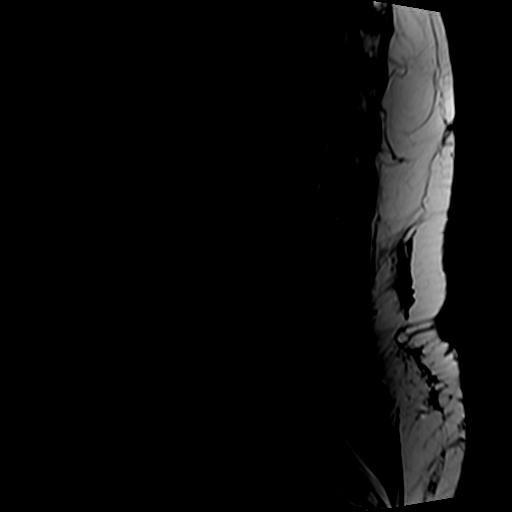
[im 12/15]
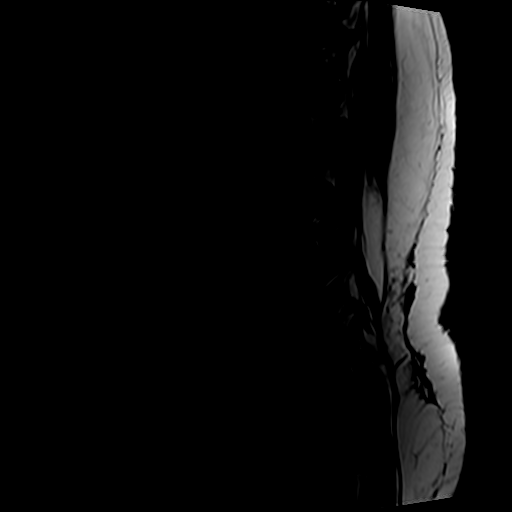
[im 15/15]
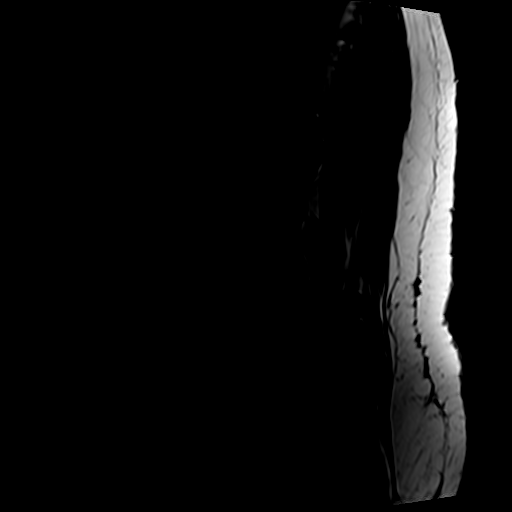

[Series 5: T2 · axial · 4.0mm · 0.70mm/px · z∈[-173,+45]mm · 9 of 38 slices shown (2 of 2)]
[im 1/38]
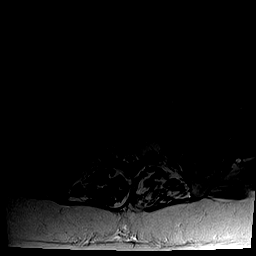
[im 6/38]
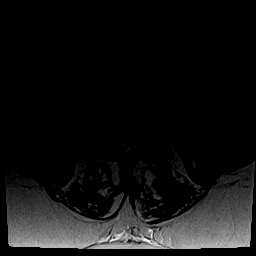
[im 11/38]
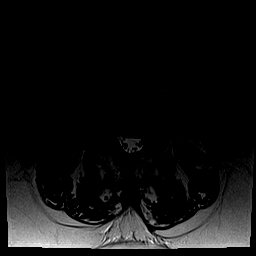
[im 16/38]
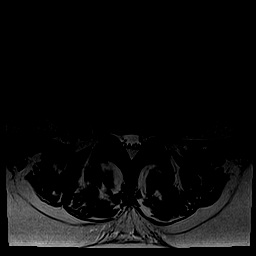
[im 19/38]
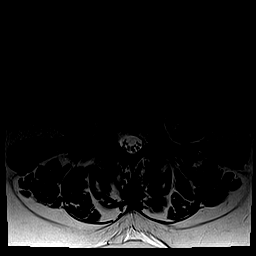
[im 22/38]
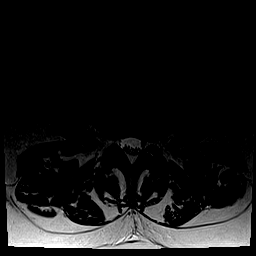
[im 27/38]
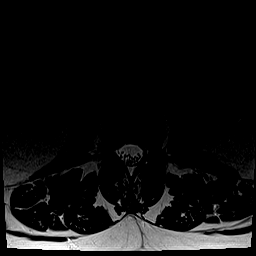
[im 32/38]
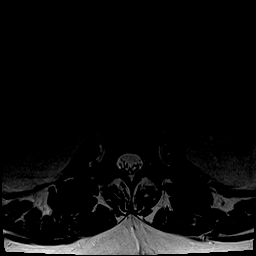
[im 38/38]
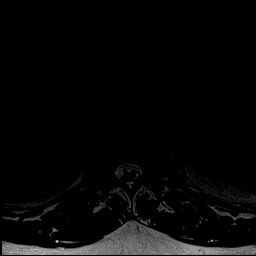

[Series 6: T1 · axial · 4.0mm · 0.35mm/px · z∈[-173,+14]mm · 4 of 38 slices shown (2 of 2)]
[im 1/38]
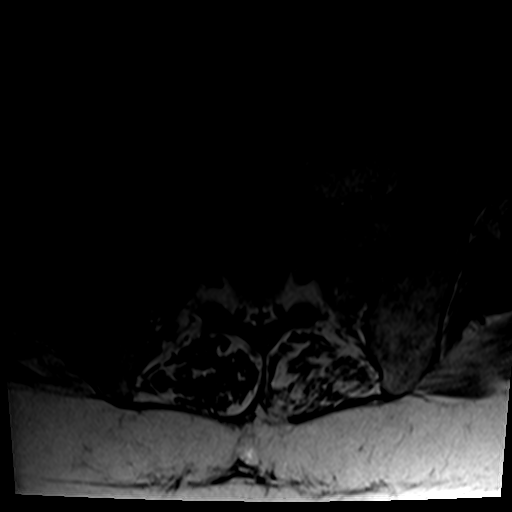
[im 6/38]
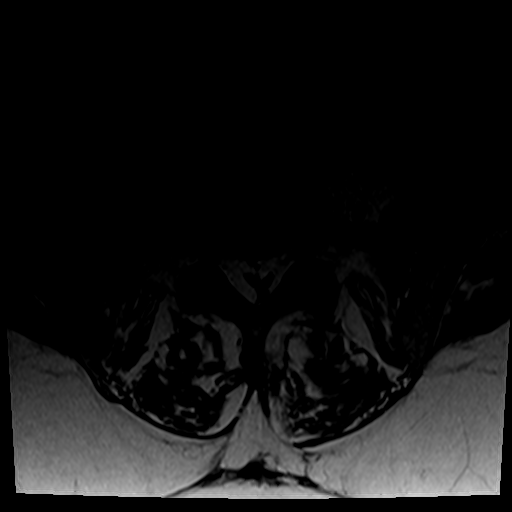
[im 19/38]
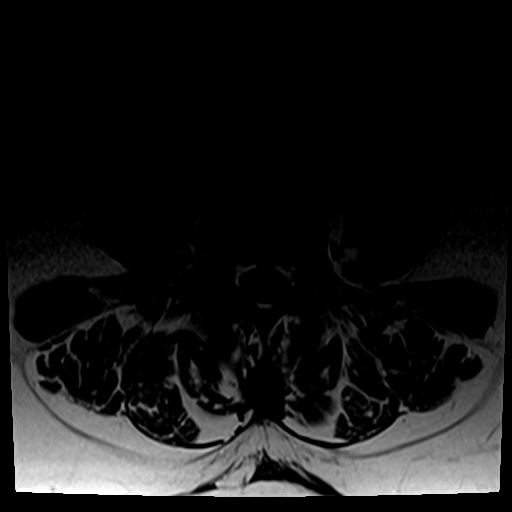
[im 32/38]
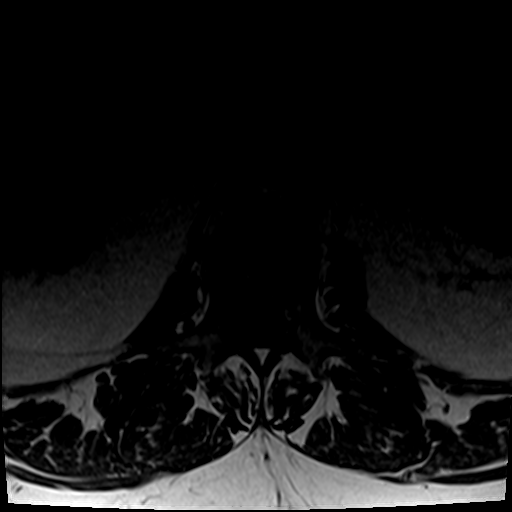

[25 of 48 positions shown; findings below may reference images not displayed]

FINDINGS: Segmentation:  Standard.

Alignment:  Maintained.

Vertebrae:  No fracture or worrisome lesion.

Conus medullaris and cauda equina: Conus extends to the L1 level.
Conus and cauda equina appear normal.

Paraspinal and other soft tissues: T2 hyperintense lesions in the
kidneys are partially visualized but appear unchanged. Otherwise
negative.

Disc levels:

T10-11 and T11-12 are imaged in the sagittal plane only and
negative.

T12-L1: Negative.

L1-2: Negative.

L2-3: Mild facet degenerative change.  Otherwise negative.

L3-4: Mild facet degenerative change.  Otherwise negative.

L4-5: Left laminectomy defect as seen on the prior exam. Broad-based
right paracentral protrusion with some caudal extension is again
seen. Mild narrowing is seen in the subarticular recesses. There is
left worse than right foraminal narrowing due to disc and facet
degenerative change. The appearance of this level is stable.

L5-S1: Prominent epidural.  Mild disc bulge without stenosis.
IMPRESSION: No change in the appearance of the lumbar spine. No new abnormality
since prior exam.

Spondylosis most notable at L4-5 where the patient is status post
left laminotomy. Broad-based central protrusion causes mild
narrowing in the subarticular recesses and left worse than right
foraminal narrowing.

## 2019-07-07 IMAGING — CR CHEST - 2 VIEW
2 series · 2 of 2 positions shown · non-contrast
Comparison: Chest x-ray dated 12/15/2009.

CLINICAL DATA: Pre admit for lumbar fusion,,htn,,no chest
comp,former smoker

EXAM:
CHEST - 2 VIEW

[w chest pa]
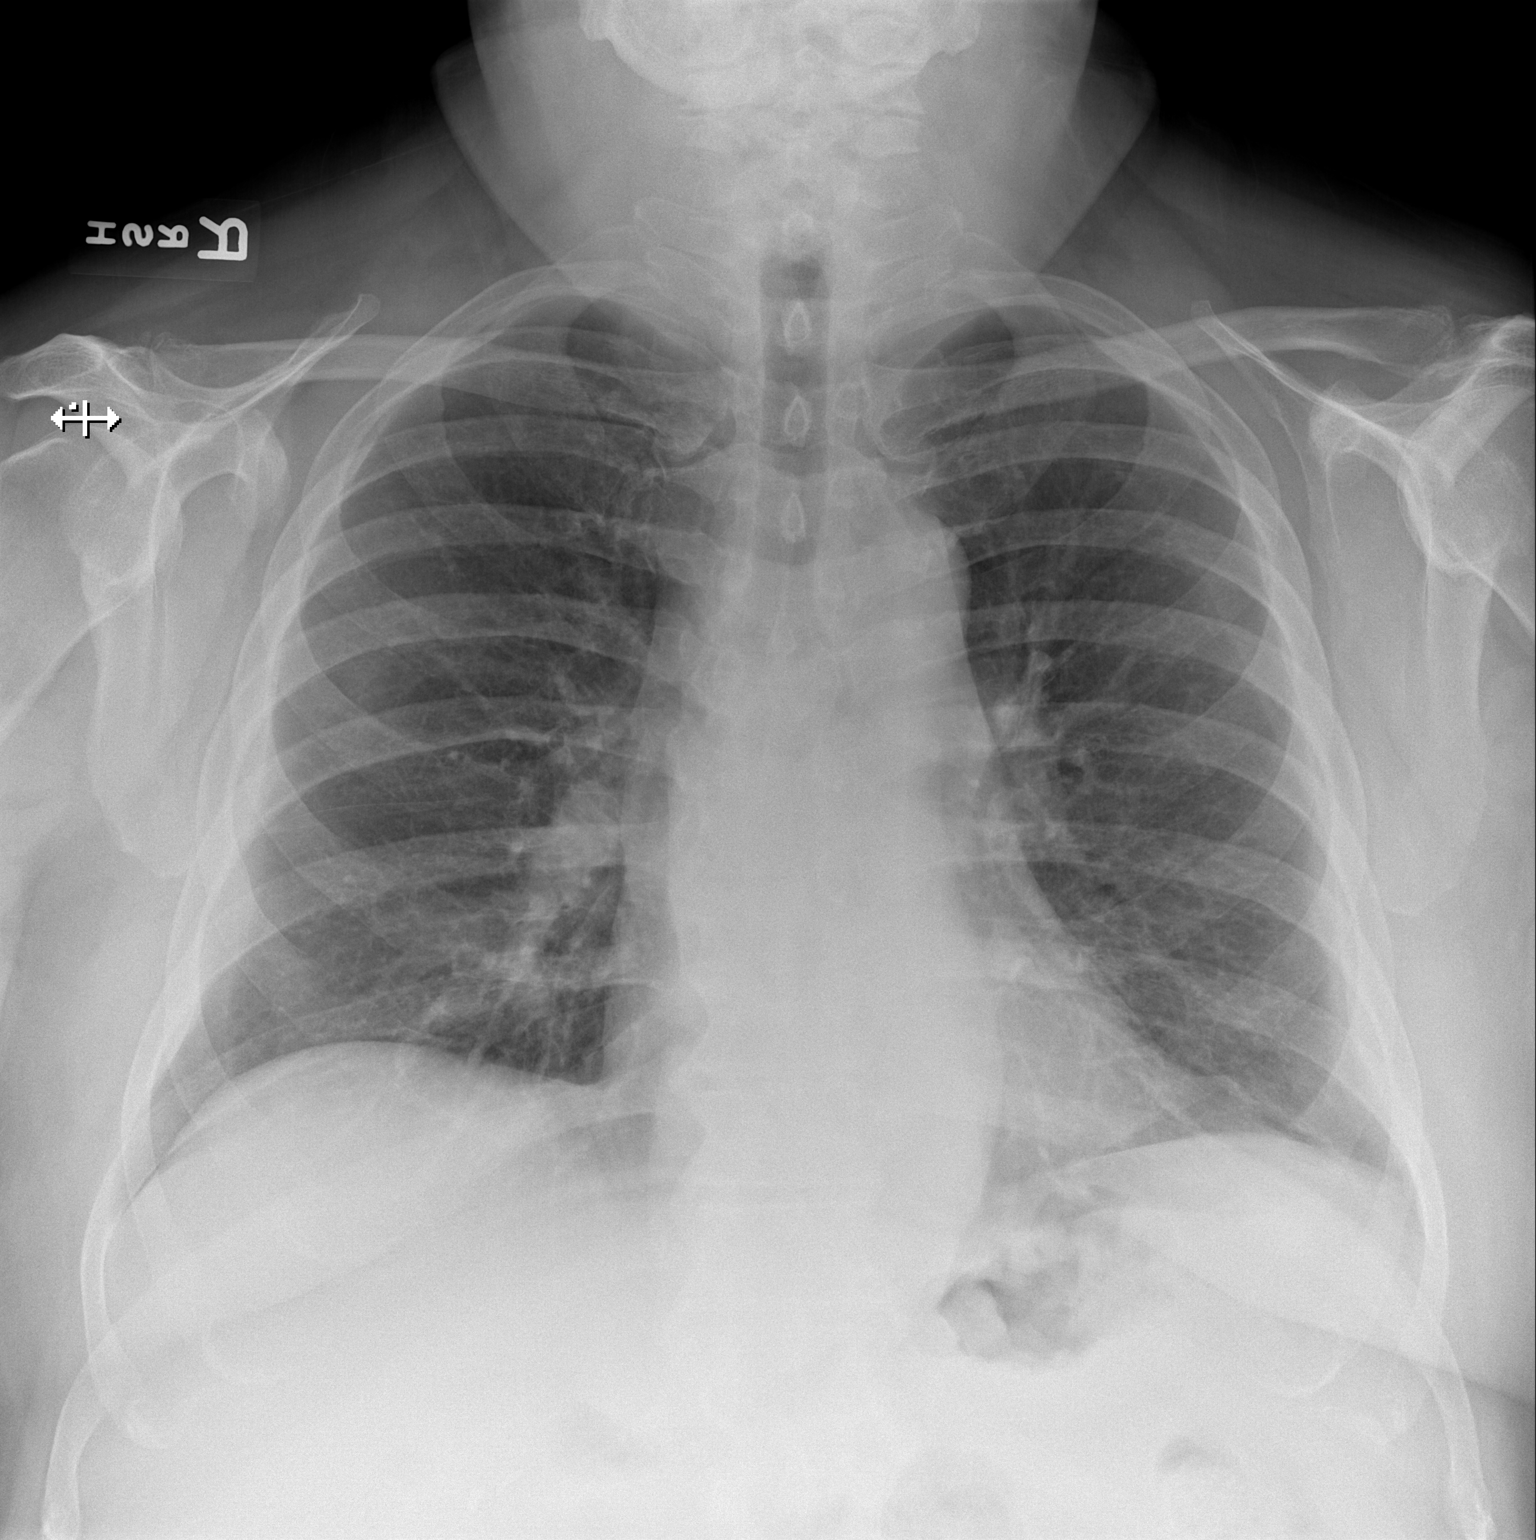

[w chest lat]
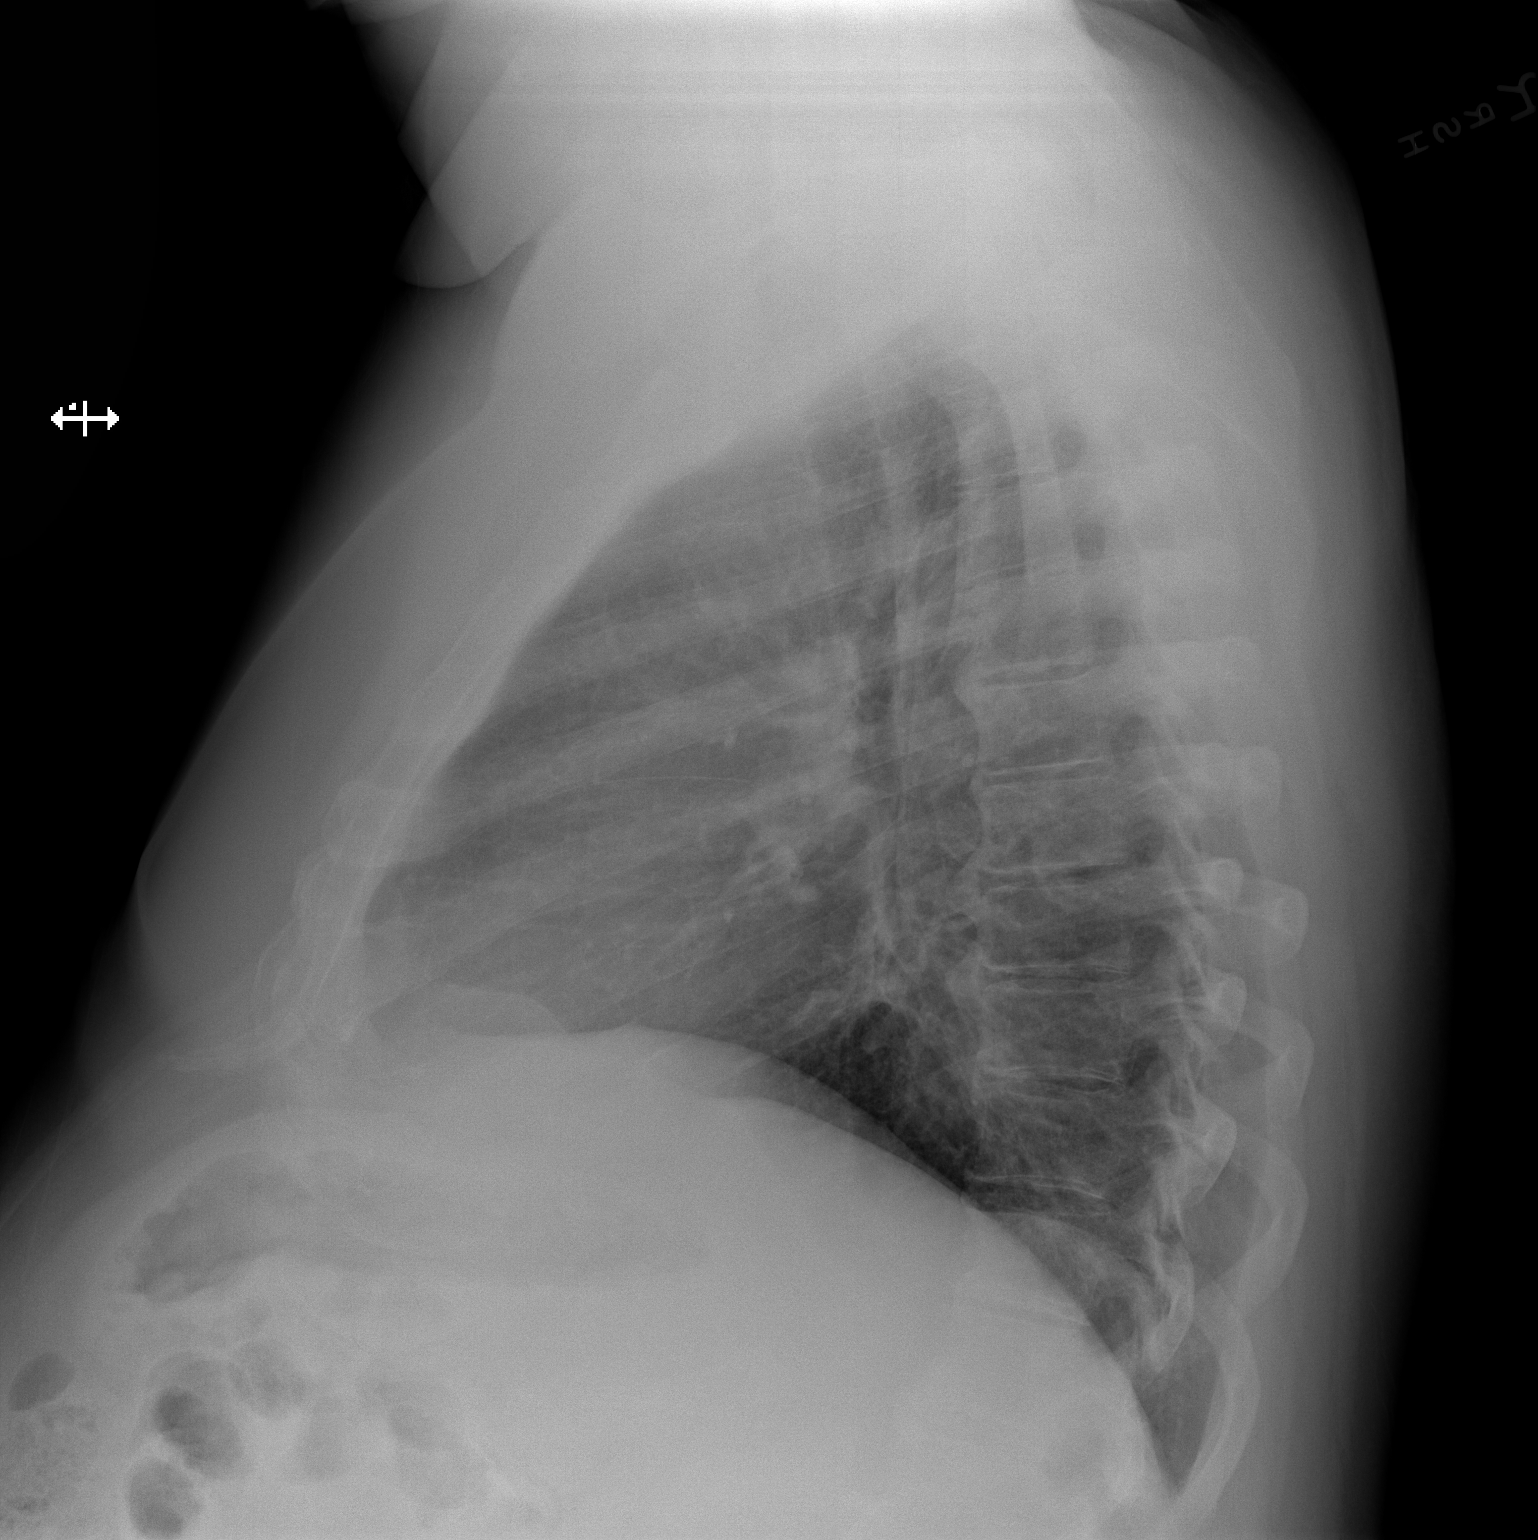

[2 of 2 positions shown; findings below may reference images not displayed]

FINDINGS: Heart size and mediastinal contours are within normal limits. Lungs
are clear. No pleural effusion or pneumothorax seen. Osseous
structures about the chest are unremarkable.
IMPRESSION: No active cardiopulmonary disease.

## 2019-07-11 IMAGING — CR LUMBAR SPINE - 2-3 VIEW
2 series · 2 of 2 positions shown · non-contrast
Comparison: MRI August 22, 2018.

CLINICAL DATA: L4-5 micro discectomy.

EXAM:
LUMBAR SPINE - 2-3 VIEW

[lateral (1 of 2)]
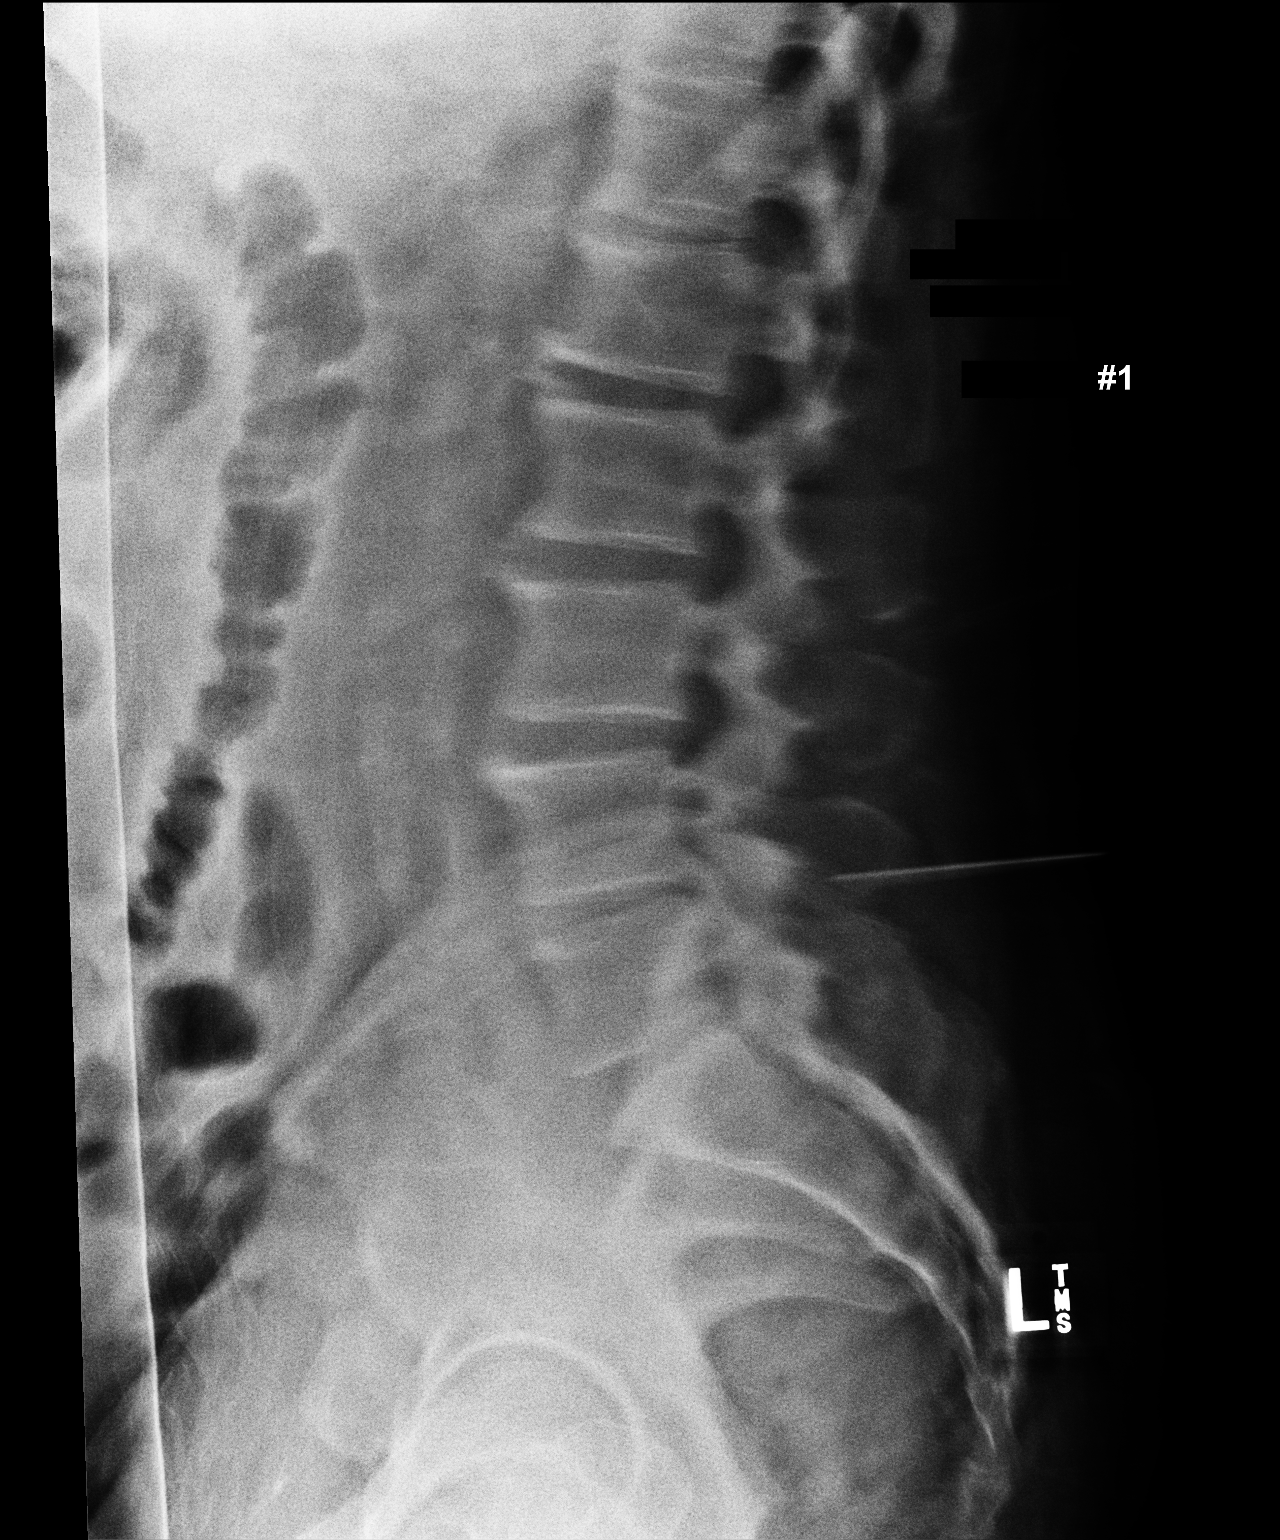

[lateral (2 of 2)]
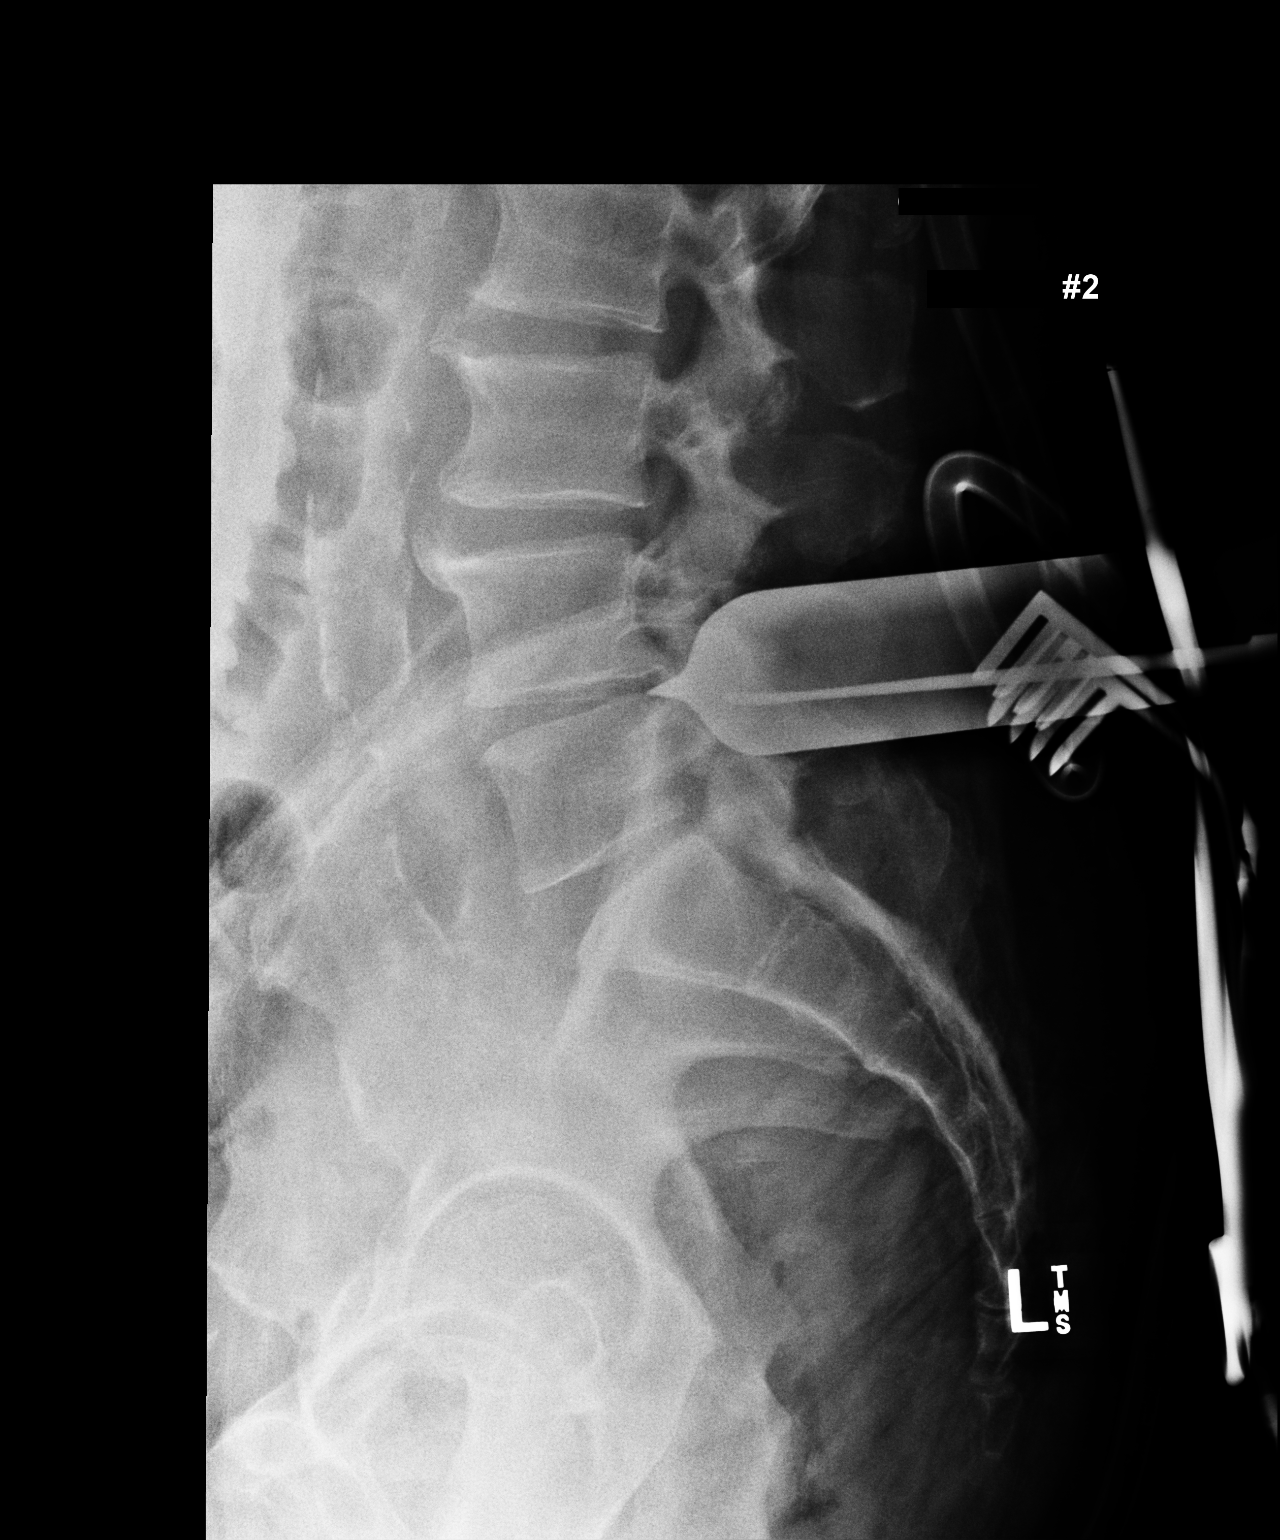

[2 of 2 positions shown; findings below may reference images not displayed]

FINDINGS: First intraoperative cross-table lateral projection of the lumbar
spine demonstrates a surgical probe directed toward the posterior
margin of L4-5 level. Second intraoperative cross-table lateral
projection demonstrates another surgical probe and retractor
posterior to L4-5.
IMPRESSION: Surgical localization as described above.
